# Patient Record
Sex: Male | Born: 1985 | Race: White | Hispanic: No | Marital: Single | Smoking: Former smoker
Health system: Southern US, Community
[De-identification: ages and names within clinical notes are randomized; demographics above are authoritative.]

## PROBLEM LIST (undated history)

## (undated) HISTORY — PX: WISDOM TOOTH EXTRACTION: SHX21

---
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
## 2003-10-03 ENCOUNTER — Emergency Department (HOSPITAL_COMMUNITY): Admission: EM | Admit: 2003-10-03 | Discharge: 2003-10-03 | Payer: Self-pay | Admitting: Emergency Medicine

## 2005-10-17 ENCOUNTER — Emergency Department (HOSPITAL_COMMUNITY): Admission: EM | Admit: 2005-10-17 | Discharge: 2005-10-17 | Payer: Self-pay | Admitting: Emergency Medicine

## 2009-05-03 ENCOUNTER — Emergency Department (HOSPITAL_COMMUNITY): Admission: EM | Admit: 2009-05-03 | Discharge: 2009-05-03 | Payer: Self-pay | Admitting: Emergency Medicine

## 2012-11-03 ENCOUNTER — Emergency Department (HOSPITAL_COMMUNITY): Payer: Self-pay

## 2012-11-03 ENCOUNTER — Emergency Department (HOSPITAL_COMMUNITY)
Admission: EM | Admit: 2012-11-03 | Discharge: 2012-11-03 | Disposition: A | Discharge status: Home / Self Care | Payer: Self-pay | Attending: Emergency Medicine | Admitting: Emergency Medicine

## 2012-11-03 ENCOUNTER — Encounter (HOSPITAL_COMMUNITY): Payer: Self-pay | Admitting: Emergency Medicine

## 2012-11-03 DIAGNOSIS — Y92410 Unspecified street and highway as the place of occurrence of the external cause: Secondary | ICD-10-CM | POA: Insufficient documentation

## 2012-11-03 DIAGNOSIS — S52599A Other fractures of lower end of unspecified radius, initial encounter for closed fracture: Secondary | ICD-10-CM | POA: Insufficient documentation

## 2012-11-03 DIAGNOSIS — Y9389 Activity, other specified: Secondary | ICD-10-CM | POA: Insufficient documentation

## 2012-11-03 DIAGNOSIS — F17200 Nicotine dependence, unspecified, uncomplicated: Secondary | ICD-10-CM | POA: Insufficient documentation

## 2012-11-03 MED ORDER — OXYCODONE-ACETAMINOPHEN 5-325 MG PO TABS: 1.0000 | ORAL_TABLET | Freq: Four times a day (QID) | ORAL | Status: DC | PRN

## 2012-11-03 MED ORDER — ONDANSETRON HCL 4 MG PO TABS
4.0000 mg | ORAL_TABLET | Freq: Once | ORAL | Status: AC
  Administered 2012-11-03: 4 mg via ORAL
  Filled 2012-11-03: qty 1

## 2012-11-03 MED ORDER — OXYCODONE-ACETAMINOPHEN 5-325 MG PO TABS
1.0000 | ORAL_TABLET | Freq: Once | ORAL | Status: AC
  Administered 2012-11-03: 1 via ORAL
  Filled 2012-11-03: qty 1

## 2012-11-03 MED ORDER — MELOXICAM 7.5 MG PO TABS: ORAL_TABLET | ORAL | Status: DC

## 2012-11-03 MED ORDER — KETOROLAC TROMETHAMINE 10 MG PO TABS
10.0000 mg | ORAL_TABLET | Freq: Once | ORAL | Status: AC
  Administered 2012-11-03: 10 mg via ORAL
  Filled 2012-11-03: qty 1
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
## 2012-11-04 NOTE — ED Provider Notes (Signed)
Medical screening examination/treatment/procedure(s) were performed by non-physician practitioner and as supervising physician I was immediately available for consultation/collaboration.  Donnetta Hutching, MD 11/04/12 [phone]

## 2012-11-10 ENCOUNTER — Ambulatory Visit: Payer: Self-pay | Admitting: Orthopedic Surgery

## 2012-11-10 ENCOUNTER — Encounter: Payer: Self-pay | Admitting: Orthopedic Surgery

## 2014-12-13 ENCOUNTER — Emergency Department (HOSPITAL_COMMUNITY): Payer: Managed Care, Other (non HMO)

## 2014-12-13 ENCOUNTER — Emergency Department (HOSPITAL_COMMUNITY)
Admission: EM | Admit: 2014-12-13 | Discharge: 2014-12-13 | Disposition: A | Discharge status: Home / Self Care | Payer: Managed Care, Other (non HMO) | Attending: Emergency Medicine | Admitting: Emergency Medicine

## 2014-12-13 ENCOUNTER — Encounter (HOSPITAL_COMMUNITY): Payer: Self-pay | Admitting: Emergency Medicine

## 2014-12-13 DIAGNOSIS — Y9289 Other specified places as the place of occurrence of the external cause: Secondary | ICD-10-CM | POA: Diagnosis not present

## 2014-12-13 DIAGNOSIS — Y9389 Activity, other specified: Secondary | ICD-10-CM | POA: Insufficient documentation

## 2014-12-13 DIAGNOSIS — S63601A Unspecified sprain of right thumb, initial encounter: Secondary | ICD-10-CM | POA: Insufficient documentation

## 2014-12-13 DIAGNOSIS — S6991XA Unspecified injury of right wrist, hand and finger(s), initial encounter: Secondary | ICD-10-CM | POA: Diagnosis present

## 2014-12-13 DIAGNOSIS — Z720 Tobacco use: Secondary | ICD-10-CM | POA: Diagnosis not present

## 2014-12-13 DIAGNOSIS — X58XXXA Exposure to other specified factors, initial encounter: Secondary | ICD-10-CM | POA: Insufficient documentation

## 2014-12-13 DIAGNOSIS — Y998 Other external cause status: Secondary | ICD-10-CM | POA: Insufficient documentation

## 2014-12-13 MED ORDER — HYDROCODONE-ACETAMINOPHEN 5-325 MG PO TABS: ORAL_TABLET | ORAL | Status: DC

## 2014-12-13 NOTE — Discharge Instructions (Signed)
Gamekeeper's, Skier's Thumb °You have injured some ligaments in your thumb. This can happen suddenly or over a long period of time. It may be difficult for you to hold things by pinching them between your thumb and finger. The injury may take 6 to 8 weeks to heal. Your injury is a strain injury and not a complete tear so it may be treated with a cast. A complete tear of the ligament can lead to a loss of function of the thumb if not fixed surgically. It got its name from when gamekeepers used to kill game (hunted or trapped animals) by pinning them down around the neck between their thumb and index finger.  °HOME CARE INSTRUCTIONS  °· Apply ice to the injury for 15-20 minutes, 03-04 times per day for the first 2 days. Put the ice in a plastic bag and place a towel between the bag of ice and your skin. °· Avoid using your thumb for as long as directed by your caregiver if it is not splinted or in a cast. °· If your hand has been casted or splinted while your thumb heals, follow these instructions: °· Plaster or fiberglass cast: °¨ Do not try to scratch the skin under the cast using a sharp or pointed object. °¨ Check the skin around the cast every day. You may put lotion on any red or sore areas. °¨ Keep your cast dry. Your cast can be protected during bathing with a plastic bag. Do not put your cast into the water. °¨ If your fiberglass cast gets wet, it can be gently dried using a hair dryer. Be careful not burn yourself. °· Plaster splint: °¨ Wear the splint for as long as directed by your caregiver or until you are seen for a follow-up examination. °¨ Do not get your splint wet. Protect it during bathing with a plastic bag. °¨ You may loosen the elastic bandage around the splint if your fingers start to get numb, tingle, get cold, or turn blue. °· Do not put pressure on your cast or splint; this may cause it to break. Do not lean on hard surfaces for 24 hours after application. °· Only take over-the-counter or  prescription medicines for pain, discomfort, or fever as directed by your caregiver. °· IMPORTANT: follow up with your caregiver or keep or call for any appointments with specialists as directed. The failure to follow up could result in chronic pain and / or disability. °SEEK IMMEDIATE MEDICAL CARE IF:  °Your thumb or fingers change color, become painful, or there is numbness or tingling in your thumb or fingers. Your cast or splint may be too tight. °MAKE SURE YOU:  °· Understand these instructions. °· Will watch your condition. °· Will get help right away if you are not doing well or get worse. °Document Released: 04/28/2005 Document Revised: 07/21/2011 Document Reviewed: 12/16/2007 °[employer]® Patient Information ©2015 [employer]. This information is not intended to replace advice given to you by your health care provider. Make sure you discuss any questions you have with your health care provider. ° °

## 2014-12-13 NOTE — ED Provider Notes (Signed)
CSN: [id number]     Arrival date & time 12/13/14  1914 History   First MD Initiated Contact with Patient 12/13/14 1046     Chief Complaint  Patient presents with  . Hand Injury    right hand     (Consider location/radiation/quality/duration/timing/severity/associated sxs/prior Treatment) HPI    Phillip Shaw is a 29 y.o. male who presents to the Emergency Department complaining of right thumb pain for three days.  He reports sudden onset of pain after a hyperextension injury.  He reports swelling and pain to his thumb with movement.  He reports taking ibuprofen with mild improvement of pain.  He denies discoloration, numbness and wrist pain.    History reviewed. No pertinent past medical history. Past Surgical History  Procedure Laterality Date  . Wisdom tooth extraction     Family History  Problem Relation Age of Onset  . Cancer Other   . Diabetes Other    History  Substance Use Topics  . Smoking status: Current Every Day Smoker -- 1.00 packs/day for 14 years    Types: Cigarettes  . Smokeless tobacco: Never Used  . Alcohol Use: 7.2 oz/week    12 Cans of beer per week    Review of Systems  Constitutional: Negative for fever and chills.  Musculoskeletal: Positive for joint swelling and arthralgias (right thumb pain).  Skin: Negative for color change and wound.  Neurological: Negative for weakness and numbness.  All other systems reviewed and are negative.     Allergies  Review of patient's allergies indicates no known allergies.  Home Medications   Prior to Admission medications   Medication Sig Start Date End Date Taking? Authorizing Provider  ibuprofen (ADVIL,MOTRIN) 200 MG tablet Take 800 mg by mouth every 6 (six) hours as needed for moderate pain.   Yes Historical Provider, MD  HYDROcodone-acetaminophen (NORCO/VICODIN) 5-325 MG per tablet Take one-two tabs po q 4-6 hrs prn pain 12/13/14   Sherea Liptak, PA-C   BP 138/97 mmHg  Pulse 57  Temp(Src) 98.1 F (36.7  C) (Oral)  Resp 16  Ht 5\' 10"  (1.778 m)  Wt 237 lb (107.502 kg)  BMI 34.01 kg/m2  SpO2 99% Physical Exam  Constitutional: He is oriented to person, place, and time. He appears well-developed and well-nourished. No distress.  HENT:  Head: Normocephalic and atraumatic.  Cardiovascular: Normal rate, regular rhythm and intact distal pulses.   Pulmonary/Chest: Effort normal and breath sounds normal.  Musculoskeletal: He exhibits edema and tenderness.  ttp of the proximal right thumb.  Edema of the thenar eminence right thumb. Distal sensation intact, CR < 2 sec.  Wrist is NT    Neurological: He is alert and oriented to person, place, and time.  Skin: Skin is warm and dry.  Nursing note and vitals reviewed.   ED Course  Procedures (including critical care time) Labs Review Labs Reviewed - No data to display  Imaging Review Dg Hand Complete Right  12/13/2014   CLINICAL DATA:  RIGHT hand pain particularly at RIGHT thumb radiating to carpal bones, thumb bent backwards while playing around, history of distal radial fracture and smoking  EXAM: RIGHT HAND - COMPLETE 3+ VIEW  COMPARISON:  RIGHT wrist radiographs 11/03/2012, RIGHT hand radiographs 10/03/2003  FINDINGS: Osseous mineralization normal.  Joint spaces preserved.  Radial styloid fracture seen on previous exam healed.  No acute fracture, dislocation, or bone destruction.  Radiopacities project at the tip of the distal phalanx question foreign bodies at nail.  IMPRESSION: No  definite acute osseous abnormalities.   Electronically Signed   By: Ulyses Southward M.D.   On: 12/13/2014 10:37     EKG Interpretation None      MDM   Final diagnoses:  Thumb sprain, right, initial encounter    Thumb spica splint applied.  Pain improved, remains NV intact.  Agrees to symptomatic tx and f/u with ortho.    Pauline Aus, PA-C 12/13/14 1926  Vanetta Mulders, MD 12/14/14 [phone]
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
## 2015-12-10 ENCOUNTER — Emergency Department (HOSPITAL_COMMUNITY)
Admission: EM | Admit: 2015-12-10 | Discharge: 2015-12-10 | Disposition: A | Discharge status: Home / Self Care | Payer: Managed Care, Other (non HMO) | Attending: Emergency Medicine | Admitting: Emergency Medicine

## 2015-12-10 ENCOUNTER — Encounter (HOSPITAL_COMMUNITY): Payer: Self-pay | Admitting: Emergency Medicine

## 2015-12-10 DIAGNOSIS — L03314 Cellulitis of groin: Secondary | ICD-10-CM | POA: Diagnosis not present

## 2015-12-10 DIAGNOSIS — L0291 Cutaneous abscess, unspecified: Secondary | ICD-10-CM

## 2015-12-10 DIAGNOSIS — Z87891 Personal history of nicotine dependence: Secondary | ICD-10-CM | POA: Insufficient documentation

## 2015-12-10 DIAGNOSIS — L0390 Cellulitis, unspecified: Secondary | ICD-10-CM

## 2015-12-10 DIAGNOSIS — L02214 Cutaneous abscess of groin: Secondary | ICD-10-CM | POA: Diagnosis present

## 2015-12-10 LAB — BASIC METABOLIC PANEL
ANION GAP: 6 (ref 5–15)
BUN: 12 mg/dL (ref 6–20)
CALCIUM: 8.9 mg/dL (ref 8.9–10.3)
CO2: 29 mmol/L (ref 22–32)
Chloride: 100 mmol/L — ABNORMAL LOW (ref 101–111)
Creatinine, Ser: 1.07 mg/dL (ref 0.61–1.24)
GFR calc non Af Amer: >60 mL/min (ref >60)
Glucose, Bld: 105 mg/dL — ABNORMAL HIGH (ref 65–99)
Potassium: 4 mmol/L (ref 3.5–5.1)
Sodium: 135 mmol/L (ref 135–145)

## 2015-12-10 LAB — CBC WITH DIFFERENTIAL/PLATELET
BASOS ABS: 0 10*3/uL (ref 0.0–0.1)
BASOS PCT: 0 %
Eosinophils Absolute: 0.2 10*3/uL (ref 0.0–0.7)
Eosinophils Relative: 2 %
HEMATOCRIT: 45.7 % (ref 39.0–52.0)
HEMOGLOBIN: 15.8 g/dL (ref 13.0–17.0)
Lymphocytes Relative: 17 %
Lymphs Abs: 1.5 10*3/uL (ref 0.7–4.0)
MCH: 31.8 pg (ref 26.0–34.0)
MCHC: 34.6 g/dL (ref 30.0–36.0)
MCV: 92 fL (ref 78.0–100.0)
MONO ABS: 1 10*3/uL (ref 0.1–1.0)
Monocytes Relative: 11 %
NEUTROS ABS: 6.2 10*3/uL (ref 1.7–7.7)
NEUTROS PCT: 70 %
Platelets: 201 10*3/uL (ref 150–400)
RBC: 4.97 MIL/uL (ref 4.22–5.81)
RDW: 12.5 % (ref 11.5–15.5)
WBC: 8.9 10*3/uL (ref 4.0–10.5)

## 2015-12-10 MED ORDER — SULFAMETHOXAZOLE-TRIMETHOPRIM 800-160 MG PO TABS: 1.0000 | ORAL_TABLET | Freq: Two times a day (BID) | ORAL | 0 refills | Status: AC

## 2015-12-10 MED ORDER — LIDOCAINE HCL (PF) 1 % IJ SOLN
5.0000 mL | Freq: Once | INTRAMUSCULAR | Status: AC
  Administered 2015-12-10: 5 mL
  Filled 2015-12-10: qty 5

## 2015-12-10 MED ORDER — MORPHINE SULFATE (PF) 4 MG/ML IV SOLN
4.0000 mg | Freq: Once | INTRAVENOUS | Status: AC
  Administered 2015-12-10: 4 mg via INTRAVENOUS
  Filled 2015-12-10: qty 1

## 2015-12-10 MED ORDER — VANCOMYCIN HCL IN DEXTROSE 1-5 GM/200ML-% IV SOLN
1000.0000 mg | Freq: Once | INTRAVENOUS | Status: AC
  Administered 2015-12-10: 1000 mg via INTRAVENOUS
  Filled 2015-12-10: qty 200

## 2015-12-10 MED ORDER — HYDROCODONE-ACETAMINOPHEN 5-325 MG PO TABS: 1.0000 | ORAL_TABLET | ORAL | 0 refills | Status: DC | PRN

## 2015-12-10 MED ORDER — ONDANSETRON HCL 4 MG/2ML IJ SOLN
4.0000 mg | Freq: Once | INTRAMUSCULAR | Status: AC
  Administered 2015-12-10: 4 mg via INTRAVENOUS
  Filled 2015-12-10: qty 2

## 2015-12-10 NOTE — ED Provider Notes (Signed)
[facility]-EMERGENCY DEPT Provider Note   CSN: [id number] Arrival date & time: 12/10/15  1104  By signing my name below, I, Alyssa Grove, attest that this documentation has been prepared under the direction and in the presence of Burgess Amor, [place]. Electronically Signed: Alyssa Grove, ED Scribe. 12/10/15, 4:45 PM.  First MD Initiated Contact with Patient 12/10/15 1355    History   Chief Complaint Chief Complaint  Patient presents with  . Abscess    The history is provided by the patient. No language interpreter was used.    HPI Comments: Phillip Shaw is a 30 y.o. male who presents to the Emergency Department complaining of a large painful area of induration and erythema on the right groin area onset 3 days. Pt states he had chills, fatigue and felt warm yesterday, but did not check his temperature to confirm a fever. He also describes lower abdominal pain and pain radiating down to his right knee. Pt states he tried to squeeze it area which caused it to produce pus and blood. Pt states 2 days ago the area started to gradually worsen. Pt states he has had similar symptoms of small skin outbreaks in his groin but not to this degree. Pt denies having PCP and is not currently taking any medications. Pt denies nausea, vomiting. He denies any known drug allergies.   Chaperone present during examination of area.  History reviewed. No pertinent past medical history.  There are no active problems to display for this patient.   Past Surgical History:  Procedure Laterality Date  . WISDOM TOOTH EXTRACTION        Home Medications    Prior to Admission medications   Medication Sig Start Date End Date Taking? Authorizing Provider  HYDROcodone-acetaminophen (NORCO/VICODIN) 5-325 MG tablet Take 1 tablet by mouth every 4 (four) hours as needed. 12/10/15   Burgess Amor, PA-C  ibuprofen (ADVIL,MOTRIN) 200 MG tablet Take 800 mg by mouth every 6 (six) hours as needed for moderate pain.    Historical  Provider, MD  sulfamethoxazole-trimethoprim (BACTRIM DS,SEPTRA DS) 800-160 MG tablet Take 1 tablet by mouth 2 (two) times daily. 12/10/15 12/17/15  Burgess Amor, PA-C    Family History Family History  Problem Relation Age of Onset  . Cancer Other   . Diabetes Other     Social History Social History  Substance Use Topics  . Smoking status: Former Smoker    Packs/day: 1.00    Years: 14.00    Types: Cigarettes    Quit date: 05/02/2015  . Smokeless tobacco: Never Used  . Alcohol use 7.2 oz/week    12 Cans of beer per week     Comment: socially     Allergies   Review of patient's allergies indicates no known allergies.   Review of Systems Review of Systems  Constitutional: Positive for chills and fatigue. Negative for fever.  Gastrointestinal: Negative for nausea and vomiting.       Negative except as mentioned in HPI. Pain in right groin.   Genitourinary: Negative.   Skin: Positive for color change.       Area of induration on right groin    Physical Exam Updated Vital Signs BP 135/76 (BP Location: Right Arm)   Pulse 86   Temp 98.6 F (37 C) (Oral)   Resp 18   Ht 5\' 9"  (1.753 m)   Wt 250 lb (113.4 kg)   SpO2 98%   BMI 36.92 kg/m   Physical Exam  Constitutional: He appears well-developed  and well-nourished.  HENT:  Head: Normocephalic.  Eyes: Conjunctivae are normal.  Cardiovascular: Normal rate.   Pulmonary/Chest: Effort normal. No respiratory distress.  Abdominal: He exhibits no distension.  Musculoskeletal: Normal range of motion.  Neurological: He is alert.  Skin: Skin is warm and dry.  Indurated raised abscess which extends from his right mid anterior groin and tracts back to his right perirectal soft tissue No active drainage Moderate erythema No red streaking No inguinale adenopathy  Psychiatric: He has a normal mood and affect. His behavior is normal.  Nursing note and vitals reviewed.    ED Treatments / Results  DIAGNOSTIC STUDIES: Oxygen  Saturation is 100% on RA, normal by my interpretation.    COORDINATION OF CARE: 2:00 PM  Discussed possible need of surgical intervention due to size. Dr. Deretha Emory will examine pt and return with treatment plan.  2:38 PM Discussed treatment plan with pt at bedside which includes BMP and CBC, IV PB Vancomycin, and Incision and Drainage and pt agreed to plan.  4:33 PM Discussed follow up appointment at ED tomorrow for recheck. Pt prescribed Hydrocodone and Bactrim and advised to soak area 3-4 times a day.   Labs (all labs ordered are listed, but only abnormal results are displayed) Labs Reviewed  BASIC METABOLIC PANEL - Abnormal; Notable for the following:       Result Value   Chloride 100 (*)    Glucose, Bld 105 (*)    All other components within normal limits  CBC WITH DIFFERENTIAL/PLATELET    EKG  EKG Interpretation None       Radiology No results found.  Procedures .Marland KitchenIncision and Drainage Date/Time: 12/10/2015 4:02 PM Performed by: Burgess Amor Authorized by: Burgess Amor   Consent:    Consent obtained:  Verbal   Consent given by:  Patient   Risks discussed:  Infection and pain Location:    Type:  Abscess   Size:  6 cm  area of induration with 1 cm area of fluctuance   Location:  Anogenital   Anogenital location:  Scrotal space Pre-procedure details:    Skin preparation:  Betadine Anesthesia (see MAR for exact dosages):    Anesthesia method:  Local infiltration   Local anesthetic:  Lidocaine 1% w/o epi Procedure type:    Complexity:  Simple Procedure details:    Needle aspiration: no     Incision types:  Single straight   Incision depth:  Subcutaneous   Scalpel blade:  11   Wound management:  Probed and deloculated and irrigated with saline   Drainage:  Purulent and bloody   Drainage amount:  Moderate   Wound treatment:  Wound left open   Packing materials:  1/2 in iodoform gauze   Amount 1/2" iodoform:  2 cm Post-procedure details:    Patient tolerance  of procedure:  Tolerated well, no immediate complications Comments:     Chaperone present through procedure     (including critical care time)  Medications Ordered in ED Medications  vancomycin (VANCOCIN) IVPB 1000 mg/200 mL premix (0 mg Intravenous Stopped 12/10/15 1624)  lidocaine (PF) (XYLOCAINE) 1 % injection 5 mL (5 mLs Other Given 12/10/15 1622)  morphine 4 MG/ML injection 4 mg (4 mg Intravenous Given 12/10/15 1623)  ondansetron (ZOFRAN) injection 4 mg (4 mg Intravenous Given 12/10/15 1623)     Initial Impression / Assessment and Plan / ED Course  I have reviewed the triage vital signs and the nursing notes.  Pertinent labs & imaging results that were  available during my care of the patient were reviewed by me and considered in my medical decision making (see chart for details).  Clinical Course     Planned recheck here tomorrow.   Final Clinical Impressions(s) / ED Diagnoses   Final diagnoses:  Abscess and cellulitis    New Prescriptions New Prescriptions   HYDROCODONE-ACETAMINOPHEN (NORCO/VICODIN) 5-325 MG TABLET    Take 1 tablet by mouth every 4 (four) hours as needed.   SULFAMETHOXAZOLE-TRIMETHOPRIM (BACTRIM DS,SEPTRA DS) 800-160 MG TABLET    Take 1 tablet by mouth 2 (two) times daily.   I personally performed the services described in this documentation, which was scribed in my presence. The recorded information has been reviewed and is accurate.    Burgess Amor, PA-C 12/10/15 1746

## 2015-12-10 NOTE — ED Provider Notes (Signed)
Medical screening examination/treatment/procedure(s) were conducted as a shared visit with non-physician practitioner(s) and myself.  I personally evaluated the patient during the encounter.   EKG Interpretation None      Patient seen by me. Patient with right-sided groin abscess that involves the base of the scrotum and cellulitis extending down to the right perianal area. Patient was able to express pus out of this area on Saturday but now the redness and swelling is increased. Patient nontoxic in appearance. Do recommend one dose of IV vancomycin a basic labs and I&D of the fluctuant site and packing. Patient will need to come back for close follow-up tomorrow.   Vanetta Mulders, MD 12/10/15 [phone]

## 2015-12-10 NOTE — ED Notes (Signed)
C/o right groin (8/10), lower abdominal (5/10) and right upper leg pain (5/10).  Area to groin swollen with raised areas noted.  C/o pain shooting from right hip down to right knee.

## 2015-12-10 NOTE — ED Triage Notes (Signed)
PT c/o abscess with some drainage to right groin x3 days with no fever.

## 2019-11-30 ENCOUNTER — Ambulatory Visit
Admission: EM | Admit: 2019-11-30 | Discharge: 2019-11-30 | Disposition: A | Discharge status: Home / Self Care | Payer: BC Managed Care – PPO | Attending: Emergency Medicine | Admitting: Emergency Medicine

## 2019-11-30 ENCOUNTER — Encounter: Payer: Self-pay | Admitting: Emergency Medicine

## 2019-11-30 DIAGNOSIS — S0502XA Injury of conjunctiva and corneal abrasion without foreign body, left eye, initial encounter: Secondary | ICD-10-CM

## 2019-11-30 MED ORDER — OFLOXACIN 0.3 % OP SOLN: 1.0000 [drp] | Freq: Four times a day (QID) | OPHTHALMIC | 0 refills | Status: DC

## 2019-11-30 NOTE — ED Provider Notes (Signed)
[facility]   [id number] 11/30/19 Arrival Time: 0915  Chief Complaint  Patient presents with  . Eye Injury    left     SUBJECTIVE:  Phillip Shaw is a 34 y.o. male who presents to the urgent care for complaint of left eye irritation with watery discharge that occurred yesterday.  Report piece of metal scratch his left eye.Marland Kitchen  Has tried OTC eye drops without relief.  Symptoms are made worse by opening his left eye.  Denies similar symptoms in the past.  Denies fever, chills, nausea, vomiting, painful eye movements, halos, discharge, itching, vision changes, double vision, FB sensation, periorbital erythema.     Denies contact lens use.    ROS: As per HPI.  All other pertinent ROS negative.     History reviewed. No pertinent past medical history. Past Surgical History:  Procedure Laterality Date  . WISDOM TOOTH EXTRACTION     No Known Allergies No current facility-administered medications on file prior to encounter.   Current Outpatient Medications on File Prior to Encounter  Medication Sig Dispense Refill  . HYDROcodone-acetaminophen (NORCO/VICODIN) 5-325 MG tablet Take 1 tablet by mouth every 4 (four) hours as needed. 20 tablet 0  . ibuprofen (ADVIL,MOTRIN) 200 MG tablet Take 800 mg by mouth every 6 (six) hours as needed for moderate pain.     Social History   Socioeconomic History  . Marital status: Single    Spouse name: Not on file  . Number of children: Not on file  . Years of education: Not on file  . Highest education level: Not on file  Occupational History  . Not on file  Tobacco Use  . Smoking status: Former Smoker    Packs/day: 1.00    Years: 14.00    Pack years: 14.00    Types: Cigarettes    Quit date: 05/02/2015    Years since quitting: 4.5  . Smokeless tobacco: Never Used  Substance and Sexual Activity  . Alcohol use: Yes    Alcohol/week: 12.0 standard drinks    Types: 12 Cans of beer per week    Comment: socially  . Drug use: No     Types: Marijuana    Comment: occasional  . Sexual activity: Yes  Other Topics Concern  . Not on file  Social History Narrative  . Not on file   Social Determinants of Health   Financial Resource Strain:   . Difficulty of Paying Living Expenses:   Food Insecurity:   . Worried About Programme researcher, broadcasting/film/video in the Last Year:   . Barista in the Last Year:   Transportation Needs:   . Freight forwarder (Medical):   Marland Kitchen Lack of Transportation (Non-Medical):   Physical Activity:   . Days of Exercise per Week:   . Minutes of Exercise per Session:   Stress:   . Feeling of Stress :   Social Connections:   . Frequency of Communication with Friends and Family:   . Frequency of Social Gatherings with Friends and Family:   . Attends Religious Services:   . Active Member of Clubs or Organizations:   . Attends Banker Meetings:   Marland Kitchen Marital Status:   Intimate Partner Violence:   . Fear of Current or Ex-Partner:   . Emotionally Abused:   Marland Kitchen Physically Abused:   . Sexually Abused:    Family History  Problem Relation Age of Onset  . Cancer Other   . Diabetes Other  OBJECTIVE:    Visual Acuity  Right Eye Distance: 20 Left Eye Distance: 20 Bilateral Distance: 20  Right Eye Near: R Near: 20 Left Eye Near:  L Near: 20 Bilateral Near:  20   Vitals:   11/30/19 0931  BP: 136/85  Pulse: 72  Resp: 16  Temp: 98.4 F (36.9 C)  TempSrc: Oral  SpO2: 97%  Weight: 250 lb (113.4 kg)    Physical Exam Vitals and nursing note reviewed.  Constitutional:      General: He is not in acute distress.    Appearance: Normal appearance. He is normal weight. He is not ill-appearing, toxic-appearing or diaphoretic.  Eyes:     General: Lids are normal. Lids are everted, no foreign bodies appreciated. Vision grossly intact. Gaze aligned appropriately. No visual field deficit.       Right eye: No foreign body, discharge or hordeolum.        Left eye: Discharge present.No  foreign body or hordeolum.     Extraocular Movements: Extraocular movements intact.     Comments: Left eye redness and irritation.  No foreign body present on exam.  Fluorescein uptake was present  Cardiovascular:     Rate and Rhythm: Normal rate and regular rhythm.     Pulses: Normal pulses.     Heart sounds: Normal heart sounds. No murmur heard.  No friction rub. No gallop.   Pulmonary:     Effort: Pulmonary effort is normal. No respiratory distress.     Breath sounds: Normal breath sounds. No stridor. No wheezing, rhonchi or rales.  Chest:     Chest wall: No tenderness.  Neurological:     Mental Status: He is alert and oriented to person, place, and time.      ASSESSMENT & PLAN:  1. Abrasion of left cornea, initial encounter     Meds ordered this encounter  Medications  . ofloxacin (OCUFLOX) 0.3 % ophthalmic solution    Sig: Place 1 drop into the left eye 4 (four) times daily.    Dispense:  5 mL    Refill:  0   Patient is stable at discharge.  Symptom is likely from corneal abrasion.  Eyes was flush in office.  Fluorescein test was completed with uptake.  Ofloxacin ophthalmic was prescribed.  Discharge Instructions  Use ofloxacin as prescribed and to completion Use OTC systane or genteal gel eye drops at night as needed for symptomatic relief Use eye patch to help keep your eyes closed Use OTC ibuprofen or tylenol as needed for pain relief Return here or follow up with ophthamolgy if symptoms persists or worsen such as fever, chills, redness, swelling, eye pain, painful eye movements, vision changes, etc...  Reviewed expectations re: course of current medical issues. Questions answered. Outlined signs and symptoms indicating need for more acute intervention. Patient verbalized understanding. After Visit Summary given.      Note: This document was prepared using Dragon voice recognition software and may include unintentional dictation errors.    Durward Parcel, FNP 11/30/19 [phone]

## 2019-11-30 NOTE — ED Triage Notes (Signed)
Yesterday piece of metal swiped his left eye. Pain in eye.  Red, inflamed eye.

## 2019-11-30 NOTE — Discharge Instructions (Addendum)
Use ofloxacin as prescribed and to completion Use OTC systane or genteal gel eye drops at night as needed for symptomatic relief Use eye patch to help keep your eyes closed Use OTC ibuprofen or tylenol as needed for pain relief Return here or follow up with ophthamolgy if symptoms persists or worsen such as fever, chills, redness, swelling, eye pain, painful eye movements, vision changes, etc..Marland Kitchen

## 2021-09-11 ENCOUNTER — Ambulatory Visit
Admission: EM | Admit: 2021-09-11 | Discharge: 2021-09-11 | Disposition: A | Discharge status: Home / Self Care | Payer: BC Managed Care – PPO | Attending: Nurse Practitioner | Admitting: Nurse Practitioner

## 2021-09-11 ENCOUNTER — Encounter: Payer: Self-pay | Admitting: Emergency Medicine

## 2021-09-11 DIAGNOSIS — R051 Acute cough: Secondary | ICD-10-CM

## 2021-09-11 DIAGNOSIS — J309 Allergic rhinitis, unspecified: Secondary | ICD-10-CM | POA: Diagnosis not present

## 2021-09-11 DIAGNOSIS — R6889 Other general symptoms and signs: Secondary | ICD-10-CM | POA: Diagnosis not present

## 2021-09-11 MED ORDER — CETIRIZINE HCL 10 MG PO TABS: 10.0000 mg | ORAL_TABLET | Freq: Every day | ORAL | 0 refills | Status: DC

## 2021-09-11 MED ORDER — ALBUTEROL SULFATE HFA 108 (90 BASE) MCG/ACT IN AERS: 2.0000 | INHALATION_SPRAY | Freq: Four times a day (QID) | RESPIRATORY_TRACT | 0 refills | Status: AC | PRN

## 2021-09-11 MED ORDER — FLUTICASONE PROPIONATE 50 MCG/ACT NA SUSP: 2.0000 | Freq: Every day | NASAL | 0 refills | Status: DC

## 2021-09-11 NOTE — Discharge Instructions (Signed)
Your COVID results should be available within the next 24 to 48 hours.  As discussed, if you activate your MyChart account, you will be able to see your results there.  You will be contacted if your results are positive. ?Increase fluids and get plenty of rest. ?Continue ibuprofen or Tylenol for pain, fever, or general discomfort. ?As discussed, it may be beneficial to wear a mask when you are outside. ?Use your albuterol inhaler for any chest tightness, shortness of breath, or difficulty breathing. ?Follow-up if symptoms worsen or do not improve. ?

## 2021-09-11 NOTE — ED Provider Notes (Signed)
[facility]    CSN: [id number] Arrival date & time: 09/11/21  4098      History   Chief Complaint No chief complaint on file.   HPI Phillip Shaw is a 36 y.o. male.   The patient is a 36 year old male who presents for flulike symptoms.  Symptoms have been present for the past week.  He states his son was taken down to [place] and was in the hospital, and he "thinks he brought back something".  Since that time, he complains of fever, chills, body aches, cough, nasal congestion, and nausea.  He also states last evening he was outside mowing and weed eating, and developed a severe allergy attack.  He states he had nasal congestion, cough, runny, and watery eyes.  Patient states "I felt like I was pepper sprayed".  He states his last fever was last evening, it was around 100.  He states he has been taking Tylenol for his symptoms.  He reports that he does not need to be seen for his foot but just wanted to make sure that it was not related to his flulike symptoms.  Patient states he would like a COVID test for his own information.  The history is provided by the patient.   History reviewed. No pertinent past medical history.  There are no problems to display for this patient.   Past Surgical History:  Procedure Laterality Date   WISDOM TOOTH EXTRACTION         Home Medications    Prior to Admission medications   Medication Sig Start Date End Date Taking? Authorizing Provider  albuterol (VENTOLIN HFA) 108 (90 Base) MCG/ACT inhaler Inhale 2 puffs into the lungs every 6 (six) hours as needed for wheezing or shortness of breath. 09/11/21  Yes Leath-Warren, Sadie Haber, NP  cetirizine (ZYRTEC) 10 MG tablet Take 1 tablet (10 mg total) by mouth daily. 09/11/21  Yes Leath-Warren, Sadie Haber, NP  fluticasone (FLONASE) 50 MCG/ACT nasal spray Place 2 sprays into both nostrils daily. 09/11/21  Yes Leath-Warren, Sadie Haber, NP  HYDROcodone-acetaminophen (NORCO/VICODIN) 5-325 MG tablet  Take 1 tablet by mouth every 4 (four) hours as needed. 12/10/15   Burgess Amor, PA-C  ibuprofen (ADVIL,MOTRIN) 200 MG tablet Take 800 mg by mouth every 6 (six) hours as needed for moderate pain.    [provider]  ofloxacin (OCUFLOX) 0.3 % ophthalmic solution Place 1 drop into the left eye 4 (four) times daily. 11/30/19   Durward Parcel, FNP    Family History Family History  Problem Relation Age of Onset   Cancer Other    Diabetes Other     Social History Social History   Tobacco Use   Smoking status: Former    Packs/day: 1.00    Years: 14.00    Pack years: 14.00    Types: Cigarettes    Quit date: 05/02/2015    Years since quitting: 6.3   Smokeless tobacco: Never  Vaping Use   Vaping Use: Never used  Substance Use Topics   Alcohol use: Yes    Alcohol/week: 12.0 standard drinks    Types: 12 Cans of beer per week    Comment: socially   Drug use: Yes    Types: Marijuana    Comment: occasional     Allergies   Patient has no known allergies.   Review of Systems Review of Systems  Constitutional:  Positive for fever.  HENT:  Positive for congestion and sore throat. Negative for ear  pain.   Eyes:  Positive for discharge and itching.  Respiratory:  Positive for cough. Negative for shortness of breath and wheezing.   Cardiovascular: Negative.   Gastrointestinal:  Positive for nausea.  Skin: Negative.   Psychiatric/Behavioral: Negative.      Physical Exam Triage Vital Signs ED Triage Vitals  Enc Vitals Group     BP 09/11/21 0934 (!) 156/98     Pulse Rate 09/11/21 0934 85     Resp 09/11/21 0934 18     Temp 09/11/21 0934 98.9 F (37.2 C)     Temp Source 09/11/21 0934 Oral     SpO2 09/11/21 0934 97 %     Weight --      Height --      Head Circumference --      Peak Flow --      Pain Score 09/11/21 0935 5     Pain Loc --      Pain Edu? --      Excl. in GC? --    No data found.  Updated Vital Signs BP (!) 156/98 (BP Location: Right Arm)   Pulse  85   Temp 98.9 F (37.2 C) (Oral)   Resp 18   SpO2 97%   Visual Acuity Right Eye Distance:   Left Eye Distance:   Bilateral Distance:    Right Eye Near:   Left Eye Near:    Bilateral Near:     Physical Exam Vitals reviewed.  Constitutional:      General: He is not in acute distress.    Appearance: Normal appearance.  HENT:     Head: Normocephalic and atraumatic.     Right Ear: Tympanic membrane, ear canal and external ear normal.     Left Ear: Tympanic membrane, ear canal and external ear normal.     Nose: Congestion and rhinorrhea present.     Mouth/Throat:     Pharynx: Posterior oropharyngeal erythema present. No oropharyngeal exudate.  Eyes:     Extraocular Movements: Extraocular movements intact.     Conjunctiva/sclera: Conjunctivae normal.     Pupils: Pupils are equal, round, and reactive to light.  Cardiovascular:     Rate and Rhythm: Normal rate and regular rhythm.     Pulses: Normal pulses.     Heart sounds: Normal heart sounds.  Pulmonary:     Effort: Pulmonary effort is normal. No respiratory distress.     Breath sounds: Normal breath sounds. No wheezing or rales.  Abdominal:     General: Bowel sounds are normal.     Palpations: Abdomen is soft.     Tenderness: There is no abdominal tenderness.  Musculoskeletal:     Cervical back: Normal range of motion.  Skin:    General: Skin is warm and dry.  Neurological:     General: No focal deficit present.     Mental Status: He is alert and oriented to person, place, and time.  Psychiatric:        Mood and Affect: Mood normal.        Behavior: Behavior normal.     UC Treatments / Results  Labs (all labs ordered are listed, but only abnormal results are displayed) Labs Reviewed  COVID-19, FLU A+B NAA    EKG   Radiology No results found.  Procedures Procedures (including critical care time)  Medications Ordered in UC Medications - No data to display  Initial Impression / Assessment and Plan /  UC Course  I have reviewed  the triage vital signs and the nursing notes.  Pertinent labs & imaging results that were available during my care of the patient were reviewed by me and considered in my medical decision making (see chart for details).  The patient is a 36 year old male who presents with flulike symptoms and allergy symptoms.  Symptoms have been present over the past week.  His allergy symptoms started last evening.  He has had intermittent fevers, with cough, body aches, and nasal congestion.  On exam, his vital signs are stable, he is in no acute distress.  Patient is requesting a COVID test today.  Symptoms are consistent of a viral upper respiratory infection vs. rhinitis.  Patient is afebrile at this time.  Will prescribe the patient symptomatic treatment for his allergy symptoms.  We will also prescribe an albuterol inhaler for his chest tightness and shortness of breath.  Patient advised to continue supportive care to include increasing fluids and getting plenty of rest.  May continue ibuprofen or Tylenol as needed for pain or discomfort. Final Clinical Impressions(s) / UC Diagnoses   Final diagnoses:  Flu-like symptoms  Allergic rhinitis, unspecified seasonality, unspecified trigger  Acute cough     Discharge Instructions      Your COVID results should be available within the next 24 to 48 hours.  As discussed, if you activate your MyChart account, you will be able to see your results there.  You will be contacted if your results are positive. Increase fluids and get plenty of rest. Continue ibuprofen or Tylenol for pain, fever, or general discomfort. As discussed, it may be beneficial to wear a mask when you are outside. Use your albuterol inhaler for any chest tightness, shortness of breath, or difficulty breathing. Follow-up if symptoms worsen or do not improve.     ED Prescriptions     Medication Sig Dispense Auth. Provider   cetirizine (ZYRTEC) 10 MG tablet Take  1 tablet (10 mg total) by mouth daily. 30 tablet Leath-Warren, Sadie Haber, NP   fluticasone (FLONASE) 50 MCG/ACT nasal spray Place 2 sprays into both nostrils daily. 16 g Leath-Warren, Sadie Haber, NP   albuterol (VENTOLIN HFA) 108 (90 Base) MCG/ACT inhaler Inhale 2 puffs into the lungs every 6 (six) hours as needed for wheezing or shortness of breath. 8 g Leath-Warren, Sadie Haber, NP      PDMP not reviewed this encounter.   Abran Cantor, NP 09/11/21 1005

## 2021-09-11 NOTE — ED Triage Notes (Signed)
Cough, sneezing, body aches, chills, x 1 week. ? ?States left foot hurts and is swollen, states no injury.   ?

## 2021-09-13 LAB — COVID-19, FLU A+B NAA
Influenza A, NAA: NOT DETECTED
Influenza B, NAA: NOT DETECTED
SARS-CoV-2, NAA: NOT DETECTED

## 2021-09-23 ENCOUNTER — Ambulatory Visit (INDEPENDENT_AMBULATORY_CARE_PROVIDER_SITE_OTHER): Payer: BC Managed Care – PPO

## 2021-09-23 ENCOUNTER — Ambulatory Visit
Admission: EM | Admit: 2021-09-23 | Discharge: 2021-09-23 | Disposition: A | Discharge status: Home / Self Care | Payer: BC Managed Care – PPO | Attending: Family Medicine | Admitting: Family Medicine

## 2021-09-23 DIAGNOSIS — M79672 Pain in left foot: Secondary | ICD-10-CM

## 2021-09-23 MED ORDER — PREDNISONE 20 MG PO TABS: 40.0000 mg | ORAL_TABLET | Freq: Every day | ORAL | 0 refills | Status: DC

## 2021-09-23 NOTE — ED Provider Notes (Signed)
?[facility] ? ? ? ?CSN: [id number] ?Arrival date & time: 09/23/21  0849 ? ? ?  ? ?History   ?Chief Complaint ?Chief Complaint  ?Patient presents with  ? Foot Pain  ?  Left foot pain and swelling  ? ? ?HPI ?Phillip Shaw is a 36 y.o. male.  ? ?Presenting today with 3-week history of constant left dorsal foot pain, swelling with no known injury prior to onset.  He denies numbness, tingling, weakness, discoloration, loss of range of motion in the foot though states that being on his feet all day does make the pain worse.  Has tried ibuprofen, hydrocodone acetaminophen with minimal relief.  No past history of chronic foot issues. ? ? ?History reviewed. No pertinent past medical history. ? ?There are no problems to display for this patient. ? ? ?Past Surgical History:  ?Procedure Laterality Date  ? WISDOM TOOTH EXTRACTION    ? ? ? ? ? ?Home Medications   ? ?Prior to Admission medications   ?Medication Sig Start Date End Date Taking? Authorizing Provider  ?predniSONE (DELTASONE) 20 MG tablet Take 2 tablets (40 mg total) by mouth daily with breakfast. 09/23/21  Yes Particia Nearing, PA-C  ?albuterol (VENTOLIN HFA) 108 (90 Base) MCG/ACT inhaler Inhale 2 puffs into the lungs every 6 (six) hours as needed for wheezing or shortness of breath. 09/11/21   Leath-Warren, Sadie Haber, NP  ?cetirizine (ZYRTEC) 10 MG tablet Take 1 tablet (10 mg total) by mouth daily. 09/11/21   Leath-Warren, Sadie Haber, NP  ?fluticasone (FLONASE) 50 MCG/ACT nasal spray Place 2 sprays into both nostrils daily. 09/11/21   Leath-Warren, Sadie Haber, NP  ?HYDROcodone-acetaminophen (NORCO/VICODIN) 5-325 MG tablet Take 1 tablet by mouth every 4 (four) hours as needed. 12/10/15   Burgess Amor, PA-C  ?ibuprofen (ADVIL,MOTRIN) 200 MG tablet Take 800 mg by mouth every 6 (six) hours as needed for moderate pain.    [provider]  ?ofloxacin (OCUFLOX) 0.3 % ophthalmic solution Place 1 drop into the left eye 4 (four) times daily. 11/30/19   Durward Parcel, FNP  ? ? ?Family History ?Family History  ?Problem Relation Age of Onset  ? Cancer Other   ? Diabetes Other   ? ? ?Social History ?Social History  ? ?Tobacco Use  ? Smoking status: Former  ?  Packs/day: 1.00  ?  Years: 14.00  ?  Pack years: 14.00  ?  Types: Cigarettes  ?  Quit date: 05/02/2015  ?  Years since quitting: 6.4  ? Smokeless tobacco: Never  ?Vaping Use  ? Vaping Use: Never used  ?Substance Use Topics  ? Alcohol use: Yes  ?  Alcohol/week: 12.0 standard drinks  ?  Types: 12 Cans of beer per week  ?  Comment: socially  ? Drug use: Yes  ?  Types: Marijuana  ?  Comment: occasional  ? ? ? ?Allergies   ?Patient has no known allergies. ? ? ?Review of Systems ?Review of Systems ?Per HPI ? ?Physical Exam ?Triage Vital Signs ?ED Triage Vitals  ?Enc Vitals Group  ?   BP 09/23/21 0941 (!) 137/92  ?   Pulse Rate 09/23/21 0941 84  ?   Resp 09/23/21 0941 20  ?   Temp 09/23/21 0941 98.1 ?F (36.7 ?C)  ?   Temp Source 09/23/21 0941 Oral  ?   SpO2 09/23/21 0941 94 %  ?   Weight --   ?   Height --   ?  Head Circumference --   ?   Peak Flow --   ?   Pain Score 09/23/21 0939 0  ?   Pain Loc --   ?   Pain Edu? --   ?   Excl. in GC? --   ? ?No data found. ? ?Updated Vital Signs ?BP (!) 137/92 (BP Location: Right Arm)   Pulse 84   Temp 98.1 ?F (36.7 ?C) (Oral)   Resp 20   SpO2 94%  ? ?Visual Acuity ?Right Eye Distance:   ?Left Eye Distance:   ?Bilateral Distance:   ? ?Right Eye Near:   ?Left Eye Near:    ?Bilateral Near:    ? ?Physical Exam ?Vitals and nursing note reviewed.  ?Constitutional:   ?   Appearance: Normal appearance.  ?HENT:  ?   Head: Atraumatic.  ?Eyes:  ?   Extraocular Movements: Extraocular movements intact.  ?   Conjunctiva/sclera: Conjunctivae normal.  ?Cardiovascular:  ?   Rate and Rhythm: Normal rate and regular rhythm.  ?Pulmonary:  ?   Effort: Pulmonary effort is normal.  ?   Breath sounds: Normal breath sounds.  ?Musculoskeletal:     ?   General: Swelling and tenderness present. No deformity  or signs of injury. Normal range of motion.  ?   Cervical back: Normal range of motion and neck supple.  ?   Comments: Trace edema in area of tenderness left dorsal foot.  Range of motion intact.  No bony deformity palpable on exam.  Normal gait.  ?Skin: ?   General: Skin is warm and dry.  ?   Findings: No bruising or erythema.  ?Neurological:  ?   Mental Status: He is oriented to person, place, and time.  ?   Comments: Left foot neurovascularly intact  ?Psychiatric:     ?   Mood and Affect: Mood normal.     ?   Thought Content: Thought content normal.     ?   Judgment: Judgment normal.  ? ?UC Treatments / Results  ?Labs ?(all labs ordered are listed, but only abnormal results are displayed) ?Labs Reviewed - No data to display ? ?EKG ? ? ?Radiology ?DG Foot Complete Left ? ?Result Date: 09/23/2021 ?CLINICAL DATA:  Three weeks of dorsal left foot pain. EXAM: LEFT FOOT - COMPLETE 3+ VIEW COMPARISON:  None Available. FINDINGS: There is no evidence of fracture or dislocation. Mild dorsal midfoot degenerative change. Soft tissues are unremarkable. IMPRESSION: No fracture or dislocation of the left foot. Mild dorsal midfoot degenerative change. Electronically Signed   By: Maudry Mayhew M.D.   On: 09/23/2021 10:02   ? ?Procedures ?Procedures (including critical care time) ? ?Medications Ordered in UC ?Medications - No data to display ? ?Initial Impression / Assessment and Plan / UC Course  ?I have reviewed the triage vital signs and the nursing notes. ? ?Pertinent labs & imaging results that were available during my care of the patient were reviewed by me and considered in my medical decision making (see chart for details). ? ?  ? ? ?Left foot x-ray showing mild arthritic changes in the area of his pain.  We will treat with prednisone, RICE protocol, over-the-counter pain relievers and follow-up with podiatry if not fully resolving or if pain recurring. ? ?Final Clinical Impressions(s) / UC Diagnoses  ? ?Final diagnoses:   ?Left foot pain  ? ? ? ?Discharge Instructions   ? ?  ?[facility] ?[address] [place], [place] [address].  ?  Main: [phone]. ? ? ? ?ED Prescriptions   ? ? Medication Sig Dispense Auth. Provider  ? predniSONE (DELTASONE) 20 MG tablet Take 2 tablets (40 mg total) by mouth daily with breakfast. 10 tablet Particia NearingLane, Cory Rama Elizabeth, PA-C  ? ?  ? ?PDMP not reviewed this encounter. ?  ?Particia NearingLane, Lian Tanori Elizabeth, PA-C ?09/23/21 1014 ? ?

## 2021-09-23 NOTE — ED Triage Notes (Signed)
Pt states for the past 3 weeks his left foot is in pain and some swelling ? ?Pt does not remember injury ?

## 2021-09-23 NOTE — Discharge Instructions (Addendum)
[facility] [address] [place], [place] [address].  Main: [phone]. 

## 2022-07-02 ENCOUNTER — Ambulatory Visit: Payer: PRIVATE HEALTH INSURANCE | Admitting: Internal Medicine

## 2022-07-02 ENCOUNTER — Encounter: Payer: Self-pay | Admitting: Internal Medicine

## 2022-07-02 VITALS — BP 128/88 | HR 81 | Temp 98.1°F | Ht 69.0 in | Wt 242.2 lb

## 2022-07-02 DIAGNOSIS — Z87891 Personal history of nicotine dependence: Secondary | ICD-10-CM

## 2022-07-02 DIAGNOSIS — H9190 Unspecified hearing loss, unspecified ear: Secondary | ICD-10-CM

## 2022-07-02 DIAGNOSIS — E6609 Other obesity due to excess calories: Secondary | ICD-10-CM

## 2022-07-02 DIAGNOSIS — L219 Seborrheic dermatitis, unspecified: Secondary | ICD-10-CM | POA: Diagnosis not present

## 2022-07-02 DIAGNOSIS — Z119 Encounter for screening for infectious and parasitic diseases, unspecified: Secondary | ICD-10-CM | POA: Diagnosis not present

## 2022-07-02 DIAGNOSIS — H833X9 Noise effects on inner ear, unspecified ear: Secondary | ICD-10-CM

## 2022-07-02 DIAGNOSIS — M1990 Unspecified osteoarthritis, unspecified site: Secondary | ICD-10-CM

## 2022-07-02 DIAGNOSIS — Z0000 Encounter for general adult medical examination without abnormal findings: Secondary | ICD-10-CM

## 2022-07-02 HISTORY — DX: Unspecified hearing loss, unspecified ear: H91.90

## 2022-07-02 HISTORY — DX: Unspecified osteoarthritis, unspecified site: M19.90

## 2022-07-02 MED ORDER — KETOCONAZOLE 2 % EX CREA: 1.0000 | TOPICAL_CREAM | Freq: Two times a day (BID) | CUTANEOUS | 0 refills | Status: DC

## 2022-07-02 NOTE — Assessment & Plan Note (Signed)
Encouraged continuing with abstinence.

## 2022-07-02 NOTE — Assessment & Plan Note (Addendum)
Mostly top of foot Offered podiatry, physical therapy, XR, pain / NSAID medication - prefers ignore for now Flares with loading

## 2022-07-02 NOTE — Assessment & Plan Note (Signed)
Seems like he has had yearly declines in his hearing the last few years associated with workplace high-volume sounds I recommend he wear double hearing protection at work and be aggressive about it and see audiology and ear nose and throat to see if anything else can be done to protect his hearing or correct it

## 2022-07-02 NOTE — Progress Notes (Signed)
[facility]  Phone: [phone]  New patient visit  Visit Date: 07/02/2022 Patient: Phillip Shaw   DOB: 04-07-1986   37 y.o. Male  MRN: [id number]  Today's healthcare provider: Loralee Pacas, MD  Assessment and Plan:   Jakory was seen today for new patient (initial visit), facial dryness/flakes and hearing loss.  Seborrheic dermatitis Overview: Patients chart review and interview were used to generate a prompt for artificial intelligence analysis ([employer] artificial intelligence) clinical decision support.  AI output was reviewed and is provided in red:  Comprehensive Review of the Case: A 37 year old male presents with a chronic, non-itchy, flaky malar rash extending to the nose and medial eyebrow, sometimes flaring at the angles of the lips, persisting for 4 years. The patient has not sought treatment except for the use of basic lotion. The rash is described as dry, flaky, and minimally erythematous without associated systemic symptoms. There is no mention of demographics beyond age and sex, symptom onset and duration specifics beyond the 4-year timeline, site of care, geographic region and travel history, patient history including medical, surgical, psychosocial, family history, physical exam findings beyond the rash description, medications, or results of any diagnostic studies. Initial treatments and responses, beyond the use of basic lotion, are not provided.  Clinical Problem Representation: A 37 year old male with a chronic, non-itchy, flaky, and minimally erythematous malar rash extending to the nose and medial eyebrow, with episodic flares at the angles of the lips, persisting for 4 years without associated systemic symptoms or significant response to basic lotion.   Most Likely Diagnoses - Seborrheic Dermatitis: This condition is characterized by flaky, erythematous patches often located on the scalp, face, and chest, fitting the description of the patient's  rash. The chronic course, facial distribution, and lack of significant itching align with seborrheic dermatitis. The minimal response to basic lotion and the absence of systemic symptoms further support this diagnosis.  - Discoid Lupus Erythematosus (DLE): DLE is a chronic cutaneous condition presenting with erythematous, scaly plaques often on the face, scalp, and ears, which can lead to scarring. The patient's malar rash and its distribution are suggestive of DLE. However, the lack of systemic symptoms and the duration of the rash without progression to scarring or other systemic involvement makes this diagnosis less likely but still possible.  - Rosacea: Characterized by facial erythema, telangiectasia, and sometimes papules and pustules, rosacea can present with erythematous, flaky skin, particularly on the nose and cheeks. The chronicity and distribution of the rash could fit with rosacea, although the absence of other common features such as flushing and ocular symptoms makes this diagnosis less likely.   Expanded Differential Diagnoses - Psoriasis: Psoriasis can present with well-demarcated, erythematous plaques with silvery scales, typically on the elbows, knees, scalp, and trunk, but can also affect the face. The chronic nature and description of the rash could suggest facial psoriasis, although the typical locations and nail changes associated with psoriasis are not mentioned.  - Contact Dermatitis: This condition, resulting from exposure to irritants or allergens, can cause erythematous, flaky rashes. The localization to the malar area and angles of the lips could suggest exposure to a specific irritant or allergen, although the chronic course without mention of new products or exposures makes this less likely.  - Tinea Faciei: A fungal infection of the face that presents with erythematous, scaly patches. The chronicity and description of the rash could fit, especially if localized to areas not  typically exposed to sunlight. However, the lack  of pruritus and the distribution make this diagnosis less likely.   Can't Miss Differential Diagnosis Given the chronic, non-life-threatening presentation described, there are no immediate "can't miss" life-threatening conditions directly suggested by the rash itself. However, it's crucial to consider systemic conditions that could present initially with cutaneous manifestations, such as systemic lupus erythematosus (SLE), if systemic symptoms develop or if the rash evolves in a manner suggestive of a more systemic process.  Assessment & Plan  A 37 year old male presents with a chronic, non-itchy, flaky malar rash extending to the nose and medial eyebrow, occasionally flaring at the angles of the lips, persisting for 4 years. The patient has not sought medical treatment beyond the use of basic lotion, and reports the skin as dry and flaky, with minimal erythema. The absence of systemic symptoms such as fever, joint pain, photosensitivity, or oral ulcers narrows the differential. The distribution and chronicity of the rash, alongside the lack of pruritus, suggest a dermatological condition rather than a systemic inflammatory or infectious process.  Differential Diagnosis: 1. Seborrheic Dermatitis: Characterized by flaky, erythematous patches often found in sebum-rich areas. The chronic course and distribution are consistent with this diagnosis. 2. Discoid Lupus Erythematosus (DLE): A chronic cutaneous form of lupus erythematosus presenting as erythematous, scaly plaques, potentially leading to scarring. The involvement of the nose and eyebrows raises this possibility. 3. Rosacea: Typically presents with facial erythema, telangiectasia, and pustules, but can have a dry, flaky appearance. Less likely given the distribution and absence of flushing or pustules. 4. Psoriasis: Chronic autoimmune condition presenting with well-demarcated, scaly plaques. Facial  involvement is less common but possible.  Plan:  Seborrheic Dermatitis/Rosacea/DLE/Psoriasis - Dx:   - Dermatological Evaluation: A thorough skin examination to assess the distribution, morphology, and presence of any additional lesions not reported.   - Skin Biopsy: To differentiate between DLE, psoriasis, and other dermatoses if the clinical presentation is ambiguous.   - Blood Tests: ANA and dsDNA to rule out systemic lupus erythematosus, especially if DLE is suspected.   - Dermoscopy: May aid in distinguishing between rosacea, seborrheic dermatitis, and other dermatological conditions.  - Tx:   - Topical Steroids: Low to medium potency for seborrheic dermatitis or mild DLE lesions.   - Antifungal Creams/Shampoos: For seborrheic dermatitis, considering its association with Malassezia species.   - Topical Calcineurin Inhibitors: Such as tacrolimus or pimecrolimus, can be effective for seborrheic dermatitis and DLE without the risk of steroid atrophy.   - Hydroxychloroquine: For DLE, after a thorough ophthalmologic examination and with regular monitoring for medication side effects.   - Topical Metronidazole/Azelaic Acid: For rosacea, to reduce inflammation and erythema.   - Moisturizers and Gentle Skincare: To address dryness and flakiness, suitable for all differential diagnoses.  - Monitoring and Follow-Up:   - Follow-Up Appointment: In 4-6 weeks to assess response to treatment and adjust the management plan as necessary.   - Skin Care Education: Counseling on gentle skin care practices, sun protection (especially for DLE), and the importance of regular follow-up for monitoring potential progression to systemic involvement in the case of DLE.   - Referral to Dermatology: If the diagnosis remains uncertain, for persistent lesions not responding to initial therapy, or if systemic involvement is suspected.  This comprehensive approach ensures a meticulous evaluation and management plan  tailored to the differential diagnoses, optimizing patient outcomes through targeted therapy and close monitoring.  Assessment & Plan: Recommmend treating as just seborrheic dermatitis for now and let me know if it fails  The mainstays  of treatment for facial seborrheic dermatitis are topical antifungals, corticosteroids, and calcineurin inhibitors. Ketoconazole 2% is as effective as hydrocortisone 1% cream. Ketoconazole 2% gel (Xolegel) significantly reduced symptoms of erythema, pruritus, and scaling compared with vehicle alone.  Orders: -     Ketoconazole; Apply 1 Application topically 2 (two) times daily.  Dispense: 60 g; Refill: 0  Preventative health care -     CBC; Future -     Comprehensive metabolic panel; Future -     Lipid panel; Future  Screening examination for infectious disease -     Hepatitis C antibody; Future -     HIV Antibody (routine testing w rflx); Future  Obesity due to excess calories, unspecified classification, unspecified whether serious comorbidity present -     Hemoglobin A1c; Future -     TSH; Future  Arthritis Overview: Especially in left ankle/foot  Assessment & Plan: Mostly top of foot Offered podiatry, physical therapy, XR, pain / NSAID medication - prefers ignore for now Flares with loading   History of smoking 10-25 pack years Overview: Approximately 1ppd x 15 years stopping about 2016  Assessment & Plan: Encouraged continuing with abstinence.   Noise-induced hearing loss, unspecified laterality Assessment & Plan: Seems like he has had yearly declines in his hearing the last few years associated with workplace high-volume sounds I recommend he wear double hearing protection at work and be aggressive about it and see audiology and ear nose and throat to see if anything else can be done to protect his hearing or correct it  Orders: -     Ambulatory referral to Audiology -     Ambulatory referral to ENT      Subjective:  Patient  presents today to establish care.  Chief Complaint  Patient presents with   New Patient (Initial Visit)   Facial dryness/flakes    For a few years.   Hearing Loss    Has decreased.    Problem-oriented charting was used to develop and update his medical history: Problem  Obesity Due to Excess Calories  Arthritis   Especially in left ankle/foot   History of Smoking 10-25 Pack Years   Approximately 1ppd x 15 years stopping about 2016   Hearing Loss  Seborrheic Dermatitis   Patients chart review and interview were used to generate a prompt for artificial intelligence analysis ([employer] artificial intelligence) clinical decision support.  AI output was reviewed and is provided in red:  Comprehensive Review of the Case: A 37 year old male presents with a chronic, non-itchy, flaky malar rash extending to the nose and medial eyebrow, sometimes flaring at the angles of the lips, persisting for 4 years. The patient has not sought treatment except for the use of basic lotion. The rash is described as dry, flaky, and minimally erythematous without associated systemic symptoms. There is no mention of demographics beyond age and sex, symptom onset and duration specifics beyond the 4-year timeline, site of care, geographic region and travel history, patient history including medical, surgical, psychosocial, family history, physical exam findings beyond the rash description, medications, or results of any diagnostic studies. Initial treatments and responses, beyond the use of basic lotion, are not provided.  Clinical Problem Representation: A 37 year old male with a chronic, non-itchy, flaky, and minimally erythematous malar rash extending to the nose and medial eyebrow, with episodic flares at the angles of the lips, persisting for 4 years without associated systemic symptoms or significant response to basic lotion.   Most Likely Diagnoses - Seborrheic Dermatitis:  This condition is characterized by  flaky, erythematous patches often located on the scalp, face, and chest, fitting the description of the patient's rash. The chronic course, facial distribution, and lack of significant itching align with seborrheic dermatitis. The minimal response to basic lotion and the absence of systemic symptoms further support this diagnosis.  - Discoid Lupus Erythematosus (DLE): DLE is a chronic cutaneous condition presenting with erythematous, scaly plaques often on the face, scalp, and ears, which can lead to scarring. The patient's malar rash and its distribution are suggestive of DLE. However, the lack of systemic symptoms and the duration of the rash without progression to scarring or other systemic involvement makes this diagnosis less likely but still possible.  - Rosacea: Characterized by facial erythema, telangiectasia, and sometimes papules and pustules, rosacea can present with erythematous, flaky skin, particularly on the nose and cheeks. The chronicity and distribution of the rash could fit with rosacea, although the absence of other common features such as flushing and ocular symptoms makes this diagnosis less likely.   Expanded Differential Diagnoses - Psoriasis: Psoriasis can present with well-demarcated, erythematous plaques with silvery scales, typically on the elbows, knees, scalp, and trunk, but can also affect the face. The chronic nature and description of the rash could suggest facial psoriasis, although the typical locations and nail changes associated with psoriasis are not mentioned.  - Contact Dermatitis: This condition, resulting from exposure to irritants or allergens, can cause erythematous, flaky rashes. The localization to the malar area and angles of the lips could suggest exposure to a specific irritant or allergen, although the chronic course without mention of new products or exposures makes this less likely.  - Tinea Faciei: A fungal infection of the face that presents with  erythematous, scaly patches. The chronicity and description of the rash could fit, especially if localized to areas not typically exposed to sunlight. However, the lack of pruritus and the distribution make this diagnosis less likely.   Can't Miss Differential Diagnosis Given the chronic, non-life-threatening presentation described, there are no immediate "can't miss" life-threatening conditions directly suggested by the rash itself. However, it's crucial to consider systemic conditions that could present initially with cutaneous manifestations, such as systemic lupus erythematosus (SLE), if systemic symptoms develop or if the rash evolves in a manner suggestive of a more systemic process.  Assessment & Plan  A 37 year old male presents with a chronic, non-itchy, flaky malar rash extending to the nose and medial eyebrow, occasionally flaring at the angles of the lips, persisting for 4 years. The patient has not sought medical treatment beyond the use of basic lotion, and reports the skin as dry and flaky, with minimal erythema. The absence of systemic symptoms such as fever, joint pain, photosensitivity, or oral ulcers narrows the differential. The distribution and chronicity of the rash, alongside the lack of pruritus, suggest a dermatological condition rather than a systemic inflammatory or infectious process.  Differential Diagnosis: 1. Seborrheic Dermatitis: Characterized by flaky, erythematous patches often found in sebum-rich areas. The chronic course and distribution are consistent with this diagnosis. 2. Discoid Lupus Erythematosus (DLE): A chronic cutaneous form of lupus erythematosus presenting as erythematous, scaly plaques, potentially leading to scarring. The involvement of the nose and eyebrows raises this possibility. 3. Rosacea: Typically presents with facial erythema, telangiectasia, and pustules, but can have a dry, flaky appearance. Less likely given the distribution and absence of  flushing or pustules. 4. Psoriasis: Chronic autoimmune condition presenting with well-demarcated, scaly plaques. Facial involvement is less common  but possible.  Plan:  Seborrheic Dermatitis/Rosacea/DLE/Psoriasis - Dx:   - Dermatological Evaluation: A thorough skin examination to assess the distribution, morphology, and presence of any additional lesions not reported.   - Skin Biopsy: To differentiate between DLE, psoriasis, and other dermatoses if the clinical presentation is ambiguous.   - Blood Tests: ANA and dsDNA to rule out systemic lupus erythematosus, especially if DLE is suspected.   - Dermoscopy: May aid in distinguishing between rosacea, seborrheic dermatitis, and other dermatological conditions.  - Tx:   - Topical Steroids: Low to medium potency for seborrheic dermatitis or mild DLE lesions.   - Antifungal Creams/Shampoos: For seborrheic dermatitis, considering its association with Malassezia species.   - Topical Calcineurin Inhibitors: Such as tacrolimus or pimecrolimus, can be effective for seborrheic dermatitis and DLE without the risk of steroid atrophy.   - Hydroxychloroquine: For DLE, after a thorough ophthalmologic examination and with regular monitoring for medication side effects.   - Topical Metronidazole/Azelaic Acid: For rosacea, to reduce inflammation and erythema.   - Moisturizers and Gentle Skincare: To address dryness and flakiness, suitable for all differential diagnoses.  - Monitoring and Follow-Up:   - Follow-Up Appointment: In 4-6 weeks to assess response to treatment and adjust the management plan as necessary.   - Skin Care Education: Counseling on gentle skin care practices, sun protection (especially for DLE), and the importance of regular follow-up for monitoring potential progression to systemic involvement in the case of DLE.   - Referral to Dermatology: If the diagnosis remains uncertain, for persistent lesions not responding to initial therapy, or if  systemic involvement is suspected.  This comprehensive approach ensures a meticulous evaluation and management plan tailored to the differential diagnoses, optimizing patient outcomes through targeted therapy and close monitoring.      Depression Screen    07/02/2022    3:50 PM  PHQ 2/9 Scores  PHQ - 2 Score 0   No results found for any visits on 07/02/22.  The following were reviewed and entered/updated into his MEDICAL RECORD [facility] History:  Diagnosis Date   Arthritis 07/02/2022   Especially in left ankle/foot   Hearing loss 07/02/2022   Past Surgical History:  Procedure Laterality Date   WISDOM TOOTH EXTRACTION     Family History  Problem Relation Age of Onset   Early death Mother    Drug abuse Mother    Diabetes Mother    Depression Mother    Cancer Mother    Early death Sister    Cancer Sister    Cancer Maternal Grandmother    Arthritis Maternal Grandmother    Cancer Maternal Grandfather    Cancer Other    Diabetes Other    Outpatient Medications Prior to Visit  Medication Sig Dispense Refill   albuterol (VENTOLIN HFA) 108 (90 Base) MCG/ACT inhaler Inhale 2 puffs into the lungs every 6 (six) hours as needed for wheezing or shortness of breath. 8 g 0   cetirizine (ZYRTEC) 10 MG tablet Take 1 tablet (10 mg total) by mouth daily. 30 tablet 0   fluticasone (FLONASE) 50 MCG/ACT nasal spray Place 2 sprays into both nostrils daily. (Patient not taking: Reported on 07/02/2022) 16 g 0   HYDROcodone-acetaminophen (NORCO/VICODIN) 5-325 MG tablet Take 1 tablet by mouth every 4 (four) hours as needed. (Patient not taking: Reported on 07/02/2022) 20 tablet 0   ibuprofen (ADVIL,MOTRIN) 200 MG tablet Take 800 mg by mouth every 6 (six) hours as needed for moderate pain. (Patient not taking:  Reported on 07/02/2022)     ofloxacin (OCUFLOX) 0.3 % ophthalmic solution Place 1 drop into the left eye 4 (four) times daily. (Patient not taking: Reported on 07/02/2022) 5 mL 0    predniSONE (DELTASONE) 20 MG tablet Take 2 tablets (40 mg total) by mouth daily with breakfast. (Patient not taking: Reported on 07/02/2022) 10 tablet 0   No facility-administered medications prior to visit.    No Known Allergies Social History   Tobacco Use   Smoking status: Former    Packs/day: 1.00    Years: 14.00    Total pack years: 14.00    Types: Cigarettes    Quit date: 05/02/2015    Years since quitting: 7.1   Smokeless tobacco: Never  Vaping Use   Vaping Use: Never used  Substance Use Topics   Alcohol use: Not Currently    Alcohol/week: 12.0 standard drinks of alcohol    Types: 12 Cans of beer per week    Comment: socially   Drug use: Not Currently    Types: Marijuana    Comment: occasional     There is no immunization history on file for this patient.  Objective:  BP 128/88 (BP Location: Left Arm, Patient Position: Sitting)   Pulse 81   Temp 98.1 F (36.7 C) (Temporal)   Ht 5' 9"$  (1.753 m)   Wt 242 lb 3.2 oz (109.9 kg)   SpO2 98%   BMI 35.77 kg/m  His Body mass index is 35.77 kg/m. indicates that he is Obese male , but waist circumference (truncal adiposity) is a better indicator of healthy body composition. He has  severity: moderate truncal adiposity in my medical opinion.  Vital signs reviewed.  Physical Exam  Wt Readings from Last 10 Encounters:  07/02/22 242 lb 3.2 oz (109.9 kg)  11/30/19 250 lb (113.4 kg)  12/10/15 250 lb (113.4 kg)  12/13/14 237 lb (107.5 kg)  11/03/12 222 lb (100.7 kg)   Weight trend (if available above) reviewed. General Appearance/Constitutional:  polite male in no acute distress Musculoskeletal: All extremities are intact.  Neurological:  Awake, alert,  No obvious focal neurological deficits or cognitive impairments Psychiatric:  Appropriate mood, pleasant demeanor Problem-specific findings:  flaky, mildly erythematous malar rash involving mainly just the epidermal skin layers of face  Results Reviewed: Results for  orders placed or performed during the hospital encounter of 09/11/21  Coronavirus (COVID-19) with Influenza A and Influenza B   Specimen: Nasopharyngeal   Naso  Result Value Ref Range   SARS-CoV-2, NAA Not Detected Not Detected   Influenza A, NAA Not Detected Not Detected   Influenza B, NAA Not Detected Not Detected   Test Information: Comment     Any labs added to problem overview.

## 2022-07-02 NOTE — Assessment & Plan Note (Signed)
Recommmend treating as just seborrheic dermatitis for now and let me know if it fails  The mainstays of treatment for facial seborrheic dermatitis are topical antifungals, corticosteroids, and calcineurin inhibitors. Ketoconazole 2% is as effective as hydrocortisone 1% cream. Ketoconazole 2% gel (Xolegel) significantly reduced symptoms of erythema, pruritus, and scaling compared with vehicle alone.

## 2022-07-10 ENCOUNTER — Ambulatory Visit: Payer: PRIVATE HEALTH INSURANCE | Admitting: Audiology

## 2022-07-14 ENCOUNTER — Other Ambulatory Visit (INDEPENDENT_AMBULATORY_CARE_PROVIDER_SITE_OTHER): Payer: PRIVATE HEALTH INSURANCE

## 2022-07-14 DIAGNOSIS — E6609 Other obesity due to excess calories: Secondary | ICD-10-CM | POA: Diagnosis not present

## 2022-07-14 DIAGNOSIS — Z0000 Encounter for general adult medical examination without abnormal findings: Secondary | ICD-10-CM | POA: Diagnosis not present

## 2022-07-14 DIAGNOSIS — Z119 Encounter for screening for infectious and parasitic diseases, unspecified: Secondary | ICD-10-CM

## 2022-07-14 LAB — COMPREHENSIVE METABOLIC PANEL
ALT: 16 U/L (ref 0–53)
AST: 14 U/L (ref 0–37)
Albumin: 4.2 g/dL (ref 3.5–5.2)
Alkaline Phosphatase: 58 U/L (ref 39–117)
BUN: 12 mg/dL (ref 6–23)
CO2: 28 mEq/L (ref 19–32)
Calcium: 9.6 mg/dL (ref 8.4–10.5)
Chloride: 104 mEq/L (ref 96–112)
Creatinine, Ser: 0.96 mg/dL (ref 0.40–1.50)
GFR: 100.05 mL/min (ref >60.00)
Glucose, Bld: 104 mg/dL — ABNORMAL HIGH (ref 70–99)
Potassium: 4.2 mEq/L (ref 3.5–5.1)
Sodium: 141 mEq/L (ref 135–145)
Total Bilirubin: 0.6 mg/dL (ref 0.2–1.2)
Total Protein: 6.8 g/dL (ref 6.0–8.3)

## 2022-07-14 LAB — TSH: TSH: 1.51 u[IU]/mL (ref 0.35–5.50)

## 2022-07-14 LAB — LIPID PANEL
Cholesterol: 172 mg/dL (ref 0–200)
HDL: 49.3 mg/dL (ref >39.00)
LDL Cholesterol: 99 mg/dL (ref 0–99)
NonHDL: 123.06
Total CHOL/HDL Ratio: 3
Triglycerides: 119 mg/dL (ref 0.0–149.0)
VLDL: 23.8 mg/dL (ref 0.0–40.0)

## 2022-07-14 LAB — CBC
HCT: 46 % (ref 39.0–52.0)
Hemoglobin: 16.3 g/dL (ref 13.0–17.0)
MCHC: 35.6 g/dL (ref 30.0–36.0)
MCV: 89.4 fl (ref 78.0–100.0)
Platelets: 243 10*3/uL (ref 150.0–400.0)
RBC: 5.14 Mil/uL (ref 4.22–5.81)
RDW: 13.8 % (ref 11.5–15.5)
WBC: 4.8 10*3/uL (ref 4.0–10.5)

## 2022-07-14 LAB — HEMOGLOBIN A1C: Hgb A1c MFr Bld: 4.9 % (ref 4.6–6.5)

## 2022-07-15 LAB — HIV ANTIBODY (ROUTINE TESTING W REFLEX): HIV 1&2 Ab, 4th Generation: NONREACTIVE

## 2022-07-15 LAB — HEPATITIS C ANTIBODY: Hepatitis C Ab: NONREACTIVE

## 2022-07-21 ENCOUNTER — Encounter: Payer: Self-pay | Admitting: Internal Medicine

## 2022-07-21 ENCOUNTER — Ambulatory Visit (INDEPENDENT_AMBULATORY_CARE_PROVIDER_SITE_OTHER): Payer: PRIVATE HEALTH INSURANCE | Admitting: Internal Medicine

## 2022-07-21 VITALS — BP 130/82 | HR 83 | Temp 98.0°F | Ht 69.0 in | Wt 244.0 lb

## 2022-07-21 DIAGNOSIS — Z0001 Encounter for general adult medical examination with abnormal findings: Secondary | ICD-10-CM | POA: Diagnosis not present

## 2022-07-21 DIAGNOSIS — R0683 Snoring: Secondary | ICD-10-CM | POA: Diagnosis not present

## 2022-07-21 DIAGNOSIS — H8090 Unspecified otosclerosis, unspecified ear: Secondary | ICD-10-CM | POA: Insufficient documentation

## 2022-07-21 DIAGNOSIS — Z23 Encounter for immunization: Secondary | ICD-10-CM | POA: Diagnosis not present

## 2022-07-21 DIAGNOSIS — H8093 Unspecified otosclerosis, bilateral: Secondary | ICD-10-CM

## 2022-07-21 DIAGNOSIS — J320 Chronic maxillary sinusitis: Secondary | ICD-10-CM

## 2022-07-21 DIAGNOSIS — J329 Chronic sinusitis, unspecified: Secondary | ICD-10-CM | POA: Insufficient documentation

## 2022-07-21 MED ORDER — TETANUS-DIPHTH-ACELL PERTUSSIS 5-2.5-18.5 LF-MCG/0.5 IM SUSY: PREFILLED_SYRINGE | INTRAMUSCULAR | 0 refills | Status: DC

## 2022-07-21 NOTE — Progress Notes (Signed)
[place] [facility] [employer]: [phone] Provider: Loralee Pacas, MD   Chief Complaint:  Phillip Shaw is a 37 y.o. assigned male at birth who presents today for his annual comprehensive physical exam.     Assessment/Plan:   Phillip Shaw was seen today for annual exam.  Encounter for annual general medical examination with abnormal findings in adult  Snoring  Otosclerosis of both ears  Chronic maxillary sinusitis  Need for Tdap vaccination -     Tdap vaccine greater than or equal to 7yo IM      Today's Health Maintenance Counseling and Anticipatory Guidance:   Eye exams:  every 1-2 years recommended.  Having vision corrected can improve the quality of day-to-day life.  Eye specialists can detect certain eye conditions such as cataracts, glaucoma and age-related macular degeneration, which could lead to sight loss.  They can also detect certain rare cancers and diabetes, among other things.  Phillip Shaw reports his last eye exam was: around 10 years ago Dental health: Discussed importance of regular tooth brushing, flossing, and dental visits q6 months.  Poor dentition can lead to serious medical problems - particularly problems with heart valves.  Phillip Shaw reports he has been keeping up with his dental visits.  He intends to do so in the future.  Sinus health: Encourage sterile saline nasal misting sinus rinses daily for pollen, to reduce allergies and risk for sinus infections.   Sterile can based misting products are recommended due to superior misting and ease of maintaining sterility. Sleep Apnea screening:  He denies any significant problems with sleep quality or hypersomnolence or being advised that he has been told that he snores or has apnea STOP-Bang Score Do you snore loudly?: Yes Do you often feel tired, fatigued, or sleepy during the daytime?: Yes Has anyone observed you stop breathing during sleep?: No, just wheezing Do you have (or are you being treated  for) high blood pressure?:  No BMI    Body Mass Index: 36.03 kg/m    @ Is BMI greater than 35 kg/m2?:  Yes Age older than 39 years old?: No Has large neck size > 40 cm (15.7 in, large male shirt size, large male collar size > 16): Yes Gender Male?:  Yes STOP-Bang Total Score(total yes answers):  4  -declined sleep referral  Cardiovascular Risk Factor Reduction:   Advised patient of need for regular exercise and diet rich and fruits and vegetables and healthy fats to reduce risk of heart attack and stroke.  Avoid first- and second-hand smoke and stimulants.   Avoid extreme exercise- exercise in moderation (150 minutes per week is a good goal) Wt Readings from Last 3 Encounters:  07/21/22 244 lb (110.7 kg)  07/02/22 242 lb 3.2 oz (109.9 kg)  11/30/19 250 lb (113.4 kg)   Body mass index is 36.03 kg/m. /  He reports his diet consists of wife cooks, ingredient household, mostly ingrredient food, not freezer food.  Usually does diet - encouraged zero beverages. 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 Health maintenance and immunizations reviewed and he was encouraged to complete anything that is due: Immunization History  Administered Date(s) Administered   Tdap 07/21/2022  Agreed to Tetanus, Diphtheria, and Pertussis (Tdap) today  There are no preventive care reminders to display for this patient.   Sexual transmitted infection screening: testing offered today, but patient declined as he feels he is low risk based on his sexual history    Substance use:  I discussed that my recommendation is total abstinence from all substances of abuse including smoke and 2nd hand smoke, alcohol, illicit drugs, smoking, inhalants, sugar.   Offered to assist with any use disorders or addictions.   Injury prevention: Discussed safety belts, safety helmets, smoke detectors. Cancer Screening: Penile cancer screening: Asked about genital warts or tumors/abnormalities of penis.  Recommended Gardasil if none present, or cryo ablation if present Testicular cancer screening:  Patient was advised to palpate testicles, scrotum, penis for masses and inform me of any.   Thyroid cancer screening: patient advised to check by palpating thyroid for nodules Prostate cancer screening:  Denies family history of prostate cancer or hematospermia so too young for screening by current guidelines  Colon cancer screening:    Denies strong family history of colon cancer or blood in stool so no screening is indicated until age 30.   Lung cancer screening: smoked 1 ppd x 13 years Skin cancer screening-  Advised regular sunscreen use. He denies worrisome, changing, or new skin lesions. Showed him pictures of melanomas for reference:     Return to care in 1 year for next preventative visit.      Loralee Pacas, MD     Subjective:   See problem-oriented charting in (overview sections of assessment & plan) for updated chart information added to chronic problems for future tracking  He has no acute complaints today.   Review of Systems  Constitutional:  Negative for chills, diaphoresis, fever, malaise/fatigue and weight loss.  HENT:  Positive for hearing loss. Negative for congestion, ear discharge, ear pain, nosebleeds, sinus pain, sore throat and tinnitus.   Eyes:  Negative for blurred vision, double vision, photophobia, pain, discharge and redness.  Respiratory:  Negative for cough, hemoptysis, sputum production, shortness of breath, wheezing and stridor.   Cardiovascular:  Negative for chest pain, palpitations, orthopnea, claudication, leg swelling and PND.  Gastrointestinal:  Positive for abdominal pain (rare intermittent). Negative for blood in stool, constipation, diarrhea, heartburn, melena, nausea and vomiting.  Genitourinary:  Negative for dysuria, flank pain, frequency, hematuria and urgency.  Musculoskeletal:  Positive for back pain and myalgias. Negative for falls, joint  pain and neck pain.  Skin:  Negative for itching and rash.  Neurological:  Negative for dizziness, tingling, tremors, sensory change, speech change, focal weakness, seizures, loss of consciousness, weakness and headaches.  Endo/Heme/Allergies:  Negative for environmental allergies and polydipsia. Does not bruise/bleed easily.  Psychiatric/Behavioral:  Negative for depression, hallucinations, memory loss, substance abuse and suicidal ideas. The patient is not nervous/anxious and does not have insomnia.      I attest that I have reviewed and confirmed the patients current medications to meet the medication reconciliation requirement  Current Outpatient Medications  Medication Sig Dispense Refill   albuterol (VENTOLIN HFA) 108 (90 Base) MCG/ACT inhaler Inhale 2 puffs into the lungs every 6 (six) hours as needed for wheezing or shortness of breath. 8 g 0   ketoconazole (NIZORAL) 2 % cream Apply 1 Application topically 2 (two) times daily. 60 g 0   No current facility-administered medications for this visit.    The following were reviewed and entered/updated in epic:    07/21/2022    3:17 PM  Depression screen PHQ 2/9  Decreased Interest 0  Down, Depressed, Hopeless 0  PHQ - 2 Score 0  Altered sleeping 1  Tired, decreased energy 1  Change in appetite 0  Feeling bad or failure about yourself  0  Trouble concentrating 3  Moving slowly or fidgety/restless 1  Suicidal thoughts 0  PHQ-9 Score 6  Difficult doing work/chores Somewhat difficult   Past Medical History:  Diagnosis Date   Arthritis 07/02/2022   Especially in left ankle/foot   Hearing loss 07/02/2022   Patient Active Problem List   Diagnosis Date Noted   Snoring 07/21/2022   Otosclerosis 07/21/2022   Chronic sinusitis 07/21/2022   Obesity due to excess calories 07/02/2022   Arthritis 07/02/2022   History of smoking 10-25 pack years 07/02/2022   Hearing loss 07/02/2022   Seborrheic dermatitis 07/02/2022   Past  Surgical History:  Procedure Laterality Date   WISDOM TOOTH EXTRACTION     Family History  Problem Relation Age of Onset   Early death Mother    Drug abuse Mother    Diabetes Mother    Depression Mother    Cancer Mother    Early death Sister    Cancer Sister    Cancer Maternal Grandmother    Arthritis Maternal Grandmother    Cancer Maternal Grandfather    Cancer Other    Diabetes Other    No Known Allergies Social History   Tobacco Use   Smoking status: Former    Packs/day: 1.00    Years: 14.00    Total pack years: 14.00    Types: Cigarettes    Quit date: 05/02/2015    Years since quitting: 7.2   Smokeless tobacco: Never  Vaping Use   Vaping Use: Never used  Substance Use Topics   Alcohol use: Not Currently    Alcohol/week: 12.0 standard drinks of alcohol    Types: 12 Cans of beer per week    Comment: socially   Drug use: Not Currently    Types: Marijuana    Comment: occasional           Objective:  Physical Exam: BP 130/82 (BP Location: Left Arm, Patient Position: Sitting)   Pulse 83   Temp 98 F (36.7 C) (Temporal)   Ht '5\' 9"'$  (1.753 m)   Wt 244 lb (110.7 kg)   SpO2 97%   BMI 36.03 kg/m   Body mass index is 36.03 kg/m. Wt Readings from Last 3 Encounters:  07/21/22 244 lb (110.7 kg)  07/02/22 242 lb 3.2 oz (109.9 kg)  11/30/19 250 lb (113.4 kg)   Gen: NAD, resting comfortably HEENT: TMs scarred white opaque bilaterally. OP clear. No thyromegaly noted.  CV: RRR with no murmurs appreciated Pulm: NWOB, CTAB with no crackles, wheezes, or rhonchi GI: Normal bowel sounds present. Soft, Nontender, Nondistended. MSK: no edema, cyanosis, or clubbing noted Skin: warm, dry Neuro: CN2-12 grossly intact. Strength 5/5 in upper and lower extremities. Reflexes symmetric and 1/5t bilaterally.  Psych: Normal affect and thought content

## 2022-07-21 NOTE — Patient Instructions (Addendum)
Recommend SnoreLab App and if you stop breathing, call for sleep referral if snoring bad or you stop breathing Encourage eye exam Encourage stop sugary beverages Encourage weight loss

## 2022-07-22 ENCOUNTER — Ambulatory Visit: Payer: PRIVATE HEALTH INSURANCE | Attending: Audiology | Admitting: Audiologist

## 2022-09-23 IMAGING — DX DG FOOT COMPLETE 3+V*L*
3 series · 3 of 3 positions shown · non-contrast
Comparison: None Available.

CLINICAL DATA: Three weeks of dorsal left foot pain.

EXAM:
LEFT FOOT - COMPLETE 3+ VIEW

[foot ap]
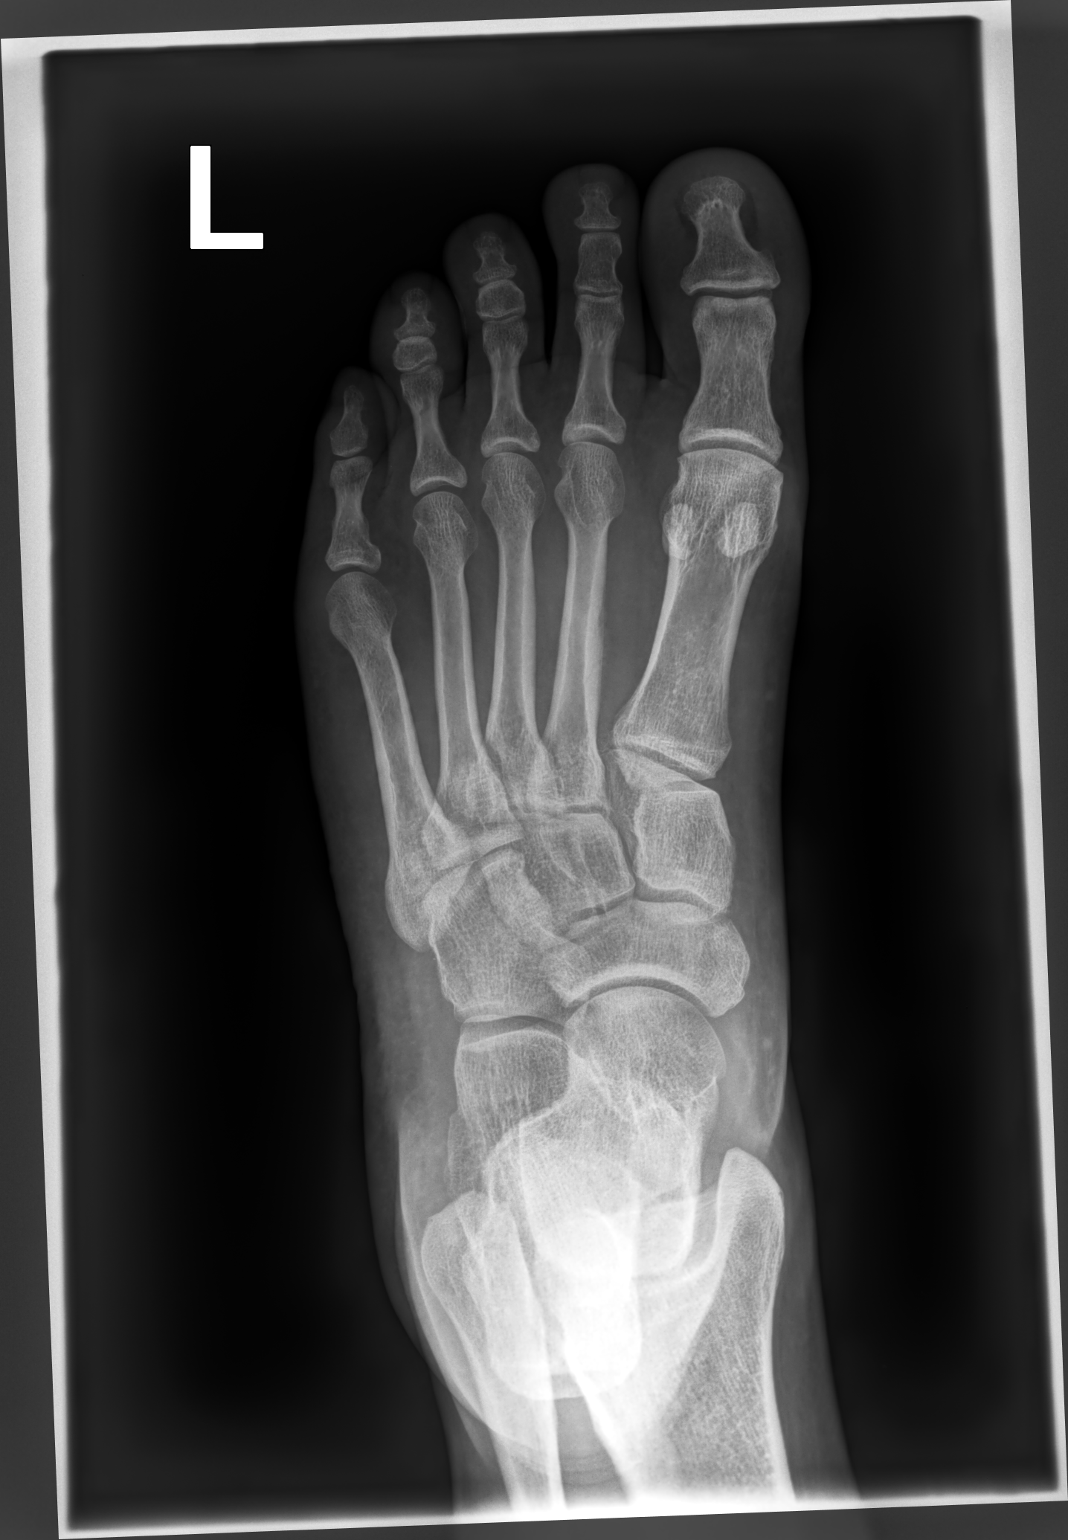

[foot mlo]
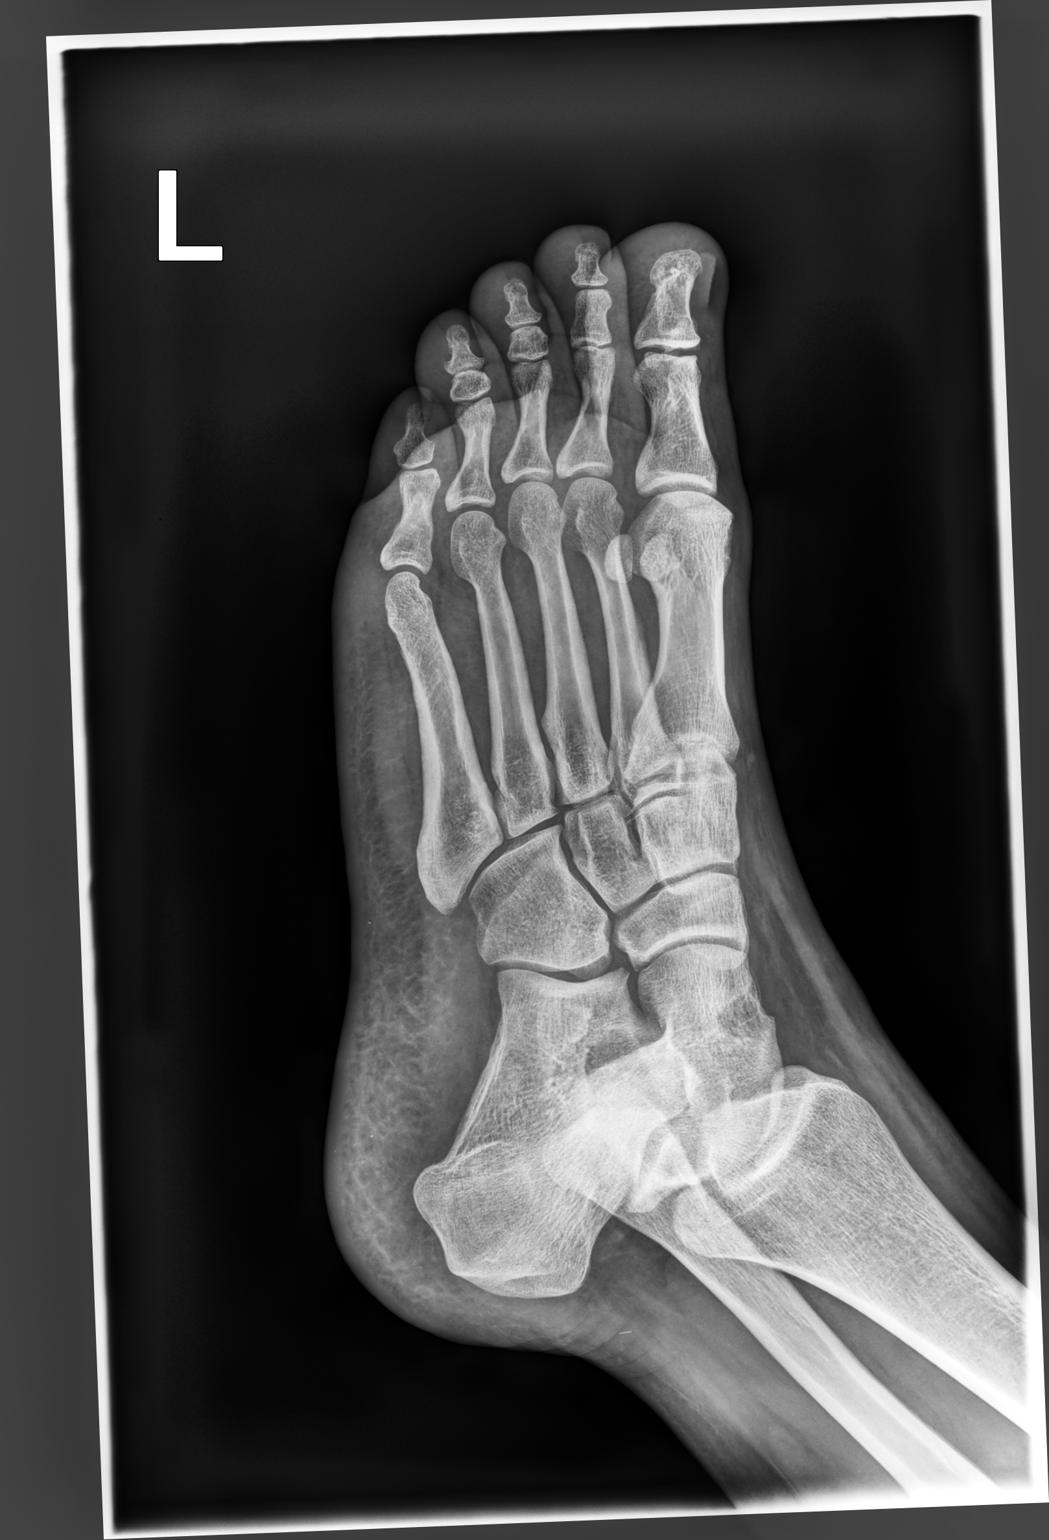

[foot lat]
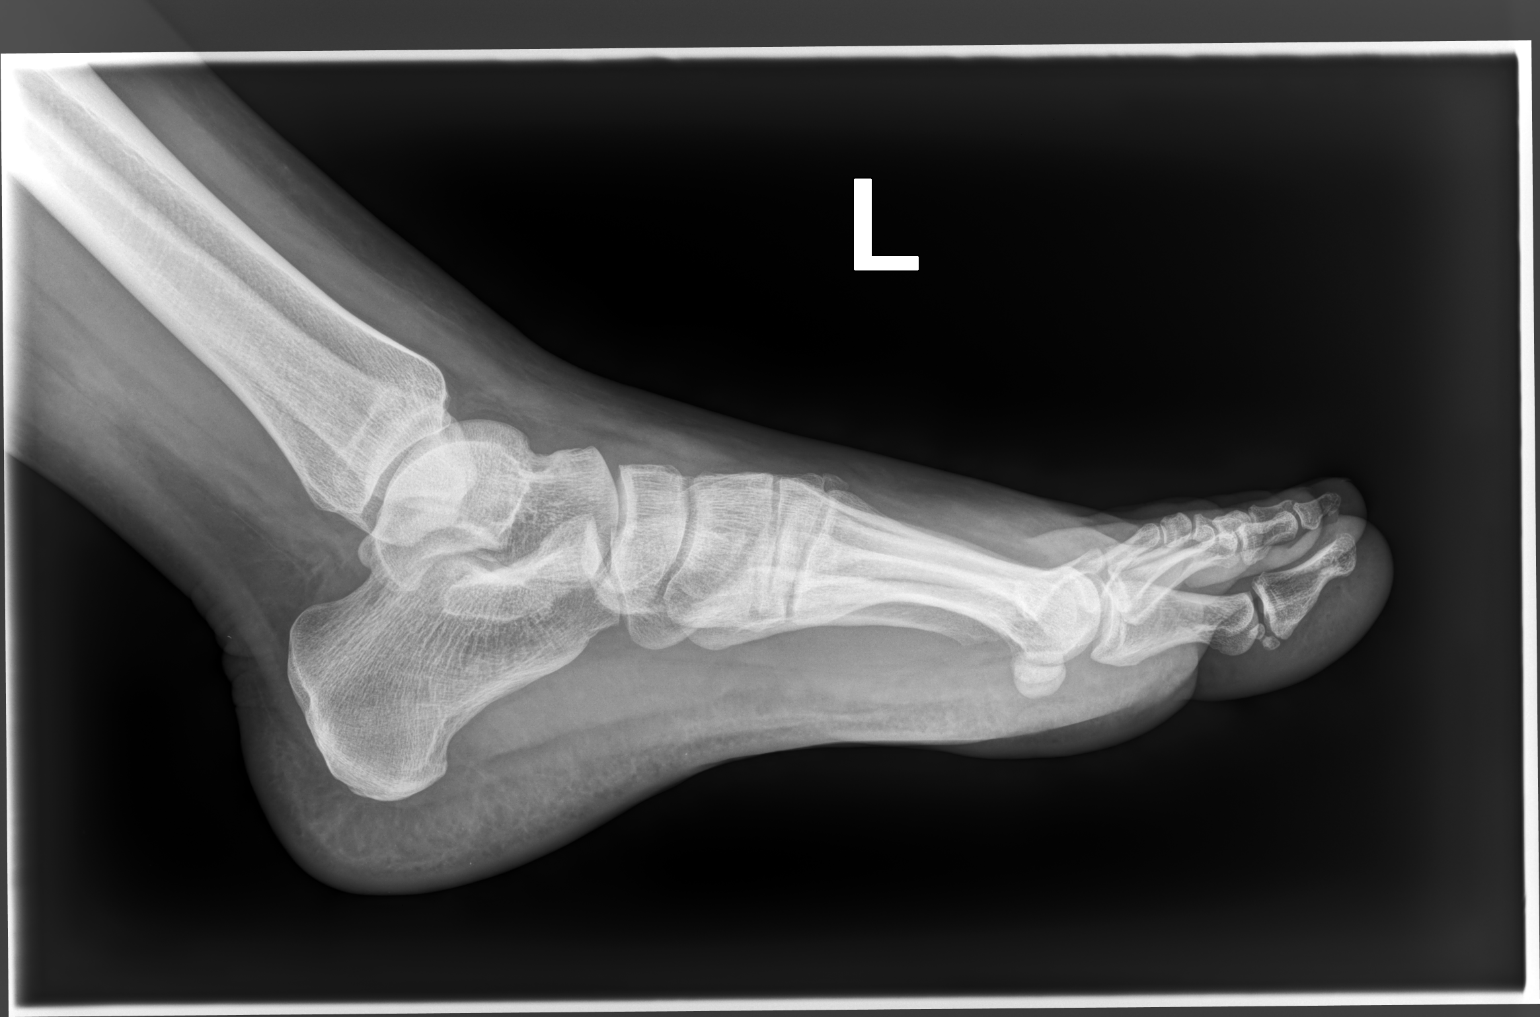

[3 of 3 positions shown; findings below may reference images not displayed]

FINDINGS: There is no evidence of fracture or dislocation. Mild dorsal midfoot
degenerative change. Soft tissues are unremarkable.
IMPRESSION: No fracture or dislocation of the left foot. Mild dorsal midfoot
degenerative change.

## 2023-07-22 ENCOUNTER — Encounter: Payer: Self-pay | Admitting: Internal Medicine

## 2023-07-22 ENCOUNTER — Ambulatory Visit (INDEPENDENT_AMBULATORY_CARE_PROVIDER_SITE_OTHER): Payer: PRIVATE HEALTH INSURANCE | Admitting: Internal Medicine

## 2023-07-22 VITALS — BP 123/81 | HR 87 | Temp 97.8°F | Ht 69.0 in | Wt 270.8 lb

## 2023-07-22 DIAGNOSIS — R400 Somnolence: Secondary | ICD-10-CM

## 2023-07-22 DIAGNOSIS — L219 Seborrheic dermatitis, unspecified: Secondary | ICD-10-CM

## 2023-07-22 DIAGNOSIS — E66812 Obesity, class 2: Secondary | ICD-10-CM | POA: Diagnosis not present

## 2023-07-22 DIAGNOSIS — R29818 Other symptoms and signs involving the nervous system: Secondary | ICD-10-CM | POA: Diagnosis not present

## 2023-07-22 DIAGNOSIS — R110 Nausea: Secondary | ICD-10-CM

## 2023-07-22 DIAGNOSIS — Z6839 Body mass index (BMI) 39.0-39.9, adult: Secondary | ICD-10-CM

## 2023-07-22 MED ORDER — ONDANSETRON 4 MG PO TBDP: 4.0000 mg | ORAL_TABLET | Freq: Three times a day (TID) | ORAL | 2 refills | Status: DC | PRN

## 2023-07-22 MED ORDER — TIRZEPATIDE-WEIGHT MANAGEMENT 2.5 MG/0.5ML ~~LOC~~ SOAJ: 2.5000 mg | SUBCUTANEOUS | 1 refills | Status: DC

## 2023-07-22 MED ORDER — KETOCONAZOLE 2 % EX CREA: 1.0000 | TOPICAL_CREAM | Freq: Two times a day (BID) | CUTANEOUS | 0 refills | Status: AC

## 2023-07-22 MED ORDER — TIRZEPATIDE-WEIGHT MANAGEMENT 2.5 MG/0.5ML ~~LOC~~ SOAJ: 2.5000 mg | SUBCUTANEOUS | 11 refills | Status: DC

## 2023-07-22 NOTE — Progress Notes (Signed)
 ==============================  [facility]: [phone]   -- Medical Office Visit --  Patient: Phillip Shaw      Age: 38 y.o.       Sex:  male  Date:   07/22/2023 Today's Healthcare Provider: Lula Olszewski, MD  ==============================   CHIEF COMPLAINT: Annual Exam Was initially planned but he preferred to focus on other issues instead  SUBJECTIVE: Background This is a 38 y.o. male who has Obesity due to excess calories; Arthritis; History of smoking 10-25 pack years; Hearing loss; Seborrheic dermatitis; Snoring; Otosclerosis; and Chronic sinusitis on their problem list.  History of Present Illness He experiences significant fatigue and daytime sleepiness, often taking naps during the day. Despite going to bed around 11 PM to 12 AM, he still feels tired. These symptoms have persisted for about two months, with a noticeable worsening over the last couple of weeks. He recalls a previous discussion about sleep apnea and describes symptoms such as snoring that sometimes wakes him up before he falls asleep. He also notes experiencing vivid dreams during short naps. He has gained approximately 15 to 20 pounds since his last visit, attributing some of the weight gain to staying home more and changes in physical activity. His wife has noticed his increased tiredness and attributes some of his sleep issues to weight gain.  He has a history of using ketoconazole cream, which was misplaced by his child but recently found. He reports a persistent rash that is worse than before and has not been consistently treated due to the misplaced medication.  He has a history of hand injuries, specifically mentioning a broken hand that causes pain, especially in the winter. He notes that this does not currently bother him significantly.   Visually reviewed and manually updated: Current Outpatient Medications on File Prior to Visit  Medication Sig   albuterol  (VENTOLIN HFA) 108 (90 Base) MCG/ACT inhaler Inhale 2 puffs into the lungs every 6 (six) hours as needed for wheezing or shortness of breath.   No current facility-administered medications on file prior to visit.   Medications Discontinued During This Encounter  Medication Reason   tirzepatide (ZEPBOUND) 2.5 MG/0.5ML Pen    ketoconazole (NIZORAL) 2 % cream Reorder      Objective      07/22/2023    2:59 PM 07/22/2023    2:45 PM 07/21/2022    3:13 PM  Vitals with BMI  Height  5\' 9"  5\' 9"   Weight  270 lbs 13 oz 244 lbs  BMI  39.97 36.02  Systolic 123 152 098  Diastolic 81 99 82  Pulse  87 83   Wt Readings from Last 10 Encounters:  07/22/23 270 lb 12.8 oz (122.8 kg)  07/21/22 244 lb (110.7 kg)  07/02/22 242 lb 3.2 oz (109.9 kg)  11/30/19 250 lb (113.4 kg)  12/10/15 250 lb (113.4 kg)  12/13/14 237 lb (107.5 kg)  11/03/12 222 lb (100.7 kg)   Vital signs reviewed.  Nursing notes reviewed. Weight trend reviewed. Physical Exam  Physical Exam   Abnormalities and Problem-Specific physical exam findings:  drowsy appearing  General Appearance:  No acute distress appreciable.   Well-groomed, healthy-appearing male.  Well proportioned with no abnormal fat distribution.  Good muscle tone. Pulmonary:  Normal work of breathing at rest, no respiratory distress apparent. SpO2: 95 %  Musculoskeletal: All extremities are intact.  Neurological:  Awake, alert, oriented, and engaged.  No obvious focal neurological deficits or cognitive  impairments.  Sensorium seems unclouded.   Speech is clear and coherent with logical content. Psychiatric:  Appropriate mood, pleasant and cooperative demeanor, thoughtful and engaged during the exam    No results found for any visits on 07/22/23. No visits with results within 1 Year(s) from this visit.  Latest known visit with results is:  Lab on 07/14/2022  Component Date Value   TSH 07/14/2022 1.51    Hgb A1c MFr Bld 07/14/2022 4.9    Cholesterol  07/14/2022 172    Triglycerides 07/14/2022 119.0    HDL 07/14/2022 49.30    VLDL 07/14/2022 23.8    LDL Cholesterol 07/14/2022 99    Total CHOL/HDL Ratio 07/14/2022 3    NonHDL 07/14/2022 123.06    HIV 1&2 Ab, 4th Generati* 07/14/2022 NON-REACTIVE    Hepatitis C Ab 07/14/2022 NON-REACTIVE    Sodium 07/14/2022 141    Potassium 07/14/2022 4.2    Chloride 07/14/2022 104    CO2 07/14/2022 28    Glucose, Bld 07/14/2022 104 (H)    BUN 07/14/2022 12    Creatinine, Ser 07/14/2022 0.96    Total Bilirubin 07/14/2022 0.6    Alkaline Phosphatase 07/14/2022 58    AST 07/14/2022 14    ALT 07/14/2022 16    Total Protein 07/14/2022 6.8    Albumin 07/14/2022 4.2    GFR 07/14/2022 100.05    Calcium 07/14/2022 9.6    WBC 07/14/2022 4.8    RBC 07/14/2022 5.14    Platelets 07/14/2022 243.0    Hemoglobin 07/14/2022 16.3    HCT 07/14/2022 46.0    MCV 07/14/2022 89.4    MCHC 07/14/2022 35.6    RDW 07/14/2022 13.8   No image results found. No results found.    Assessment & Plan Drowsiness He exhibits excessive daytime sleepiness, frequent naps, and disruptive snoring, indicating possible obstructive sleep apnea. This condition may lead to daytime fatigue and increased cardiovascular risk, with potential consequences such as dementia and early myocardial infarction. He is near the weight threshold for bariatric surgery, which could worsen sleep apnea. Discussed tirzepatide (Zepbound) for weight loss, as reducing neck weight can improve sleep apnea. Considered home versus in-lab sleep study, with the latter as the gold standard. Plan: Refer for in-lab sleep study to confirm diagnosis. Prescribe tirzepatide for weight loss. Discuss potential side effects, including constipation and nausea. Prescribe ondansetron (Zofran) for nausea and advise increased water intake for constipation. Recommend CPAP therapy if sleep apnea is confirmed. Suspected sleep apnea He exhibits excessive daytime sleepiness, frequent  naps, and disruptive snoring, indicating possible obstructive sleep apnea. This condition may lead to daytime fatigue and increased cardiovascular risk, with potential consequences such as dementia and early myocardial infarction. He is near the weight threshold for bariatric surgery, which could worsen sleep apnea. Discussed tirzepatide (Zepbound) for weight loss, as reducing neck weight can improve sleep apnea. Considered home versus in-lab sleep study, with the latter as the gold standard. Plan: Refer for in-lab sleep study to confirm diagnosis. Prescribe tirzepatide for weight loss. Discuss potential side effects, including constipation and nausea. Prescribe ondansetron (Zofran) for nausea and advise increased water intake for constipation. Recommend CPAP therapy if sleep apnea is confirmed. Class 2 severe obesity due to excess calories with serious comorbidity and body mass index (BMI) of 39.0 to 39.9 in adult [facility]) His weight has increased by 15-20 pounds since the last visit, nearing the bariatric surgery threshold. Obesity contributes to sleep apnea and fatigue. Discussed weight loss benefits, including improved sleep apnea and  overall health. Explained tirzepatide as a GLP-1 agonist that reduces appetite and promotes weight loss, potentially achieving a 20% reduction. Emphasized managing cortisol through dietary changes and timing of physical activity. Plan: Prescribe tirzepatide for weight loss. Advise dietary changes, including skipping breakfast to manage cortisol. Encourage afternoon or evening physical activity to optimize muscle gain and reduce cortisol impact. Nausea  Seborrheic dermatitis He has a persistent rash inadequately managed due to the loss of ketoconazole cream. This requires management, especially if CPAP therapy is initiated. Plan: Re-prescribe ketoconazole 2% cream and advise consistent use to manage the rash.  Follow-up   He requires follow-up to evaluate treatment efficacy  and conduct an annual exam with blood work. Emphasized fasting before blood work. Plan: Schedule follow-up in 28 days for annual exam and blood work. Ensure fasting before blood work.       Orders Placed During this Encounter:   Orders Placed This Encounter  Procedures   Ambulatory referral to Sleep Studies    Referral Priority:   Routine    Referral Type:   Consultation    Referral Reason:   Specialty Services Required    Number of Visits Requested:   1   Meds ordered this encounter  Medications   DISCONTD: tirzepatide (ZEPBOUND) 2.5 MG/0.5ML Pen    Sig: Inject 2.5 mg into the skin once a week.    Dispense:  2 mL    Refill:  11   ondansetron (ZOFRAN-ODT) 4 MG disintegrating tablet    Sig: Take 1 tablet (4 mg total) by mouth every 8 (eight) hours as needed for nausea or vomiting.    Dispense:  3 tablet    Refill:  2   tirzepatide (ZEPBOUND) 2.5 MG/0.5ML Pen    Sig: Inject 2.5 mg into the skin once a week. If constipation, take more water with fiber supplement; if diarrhea, eat less fat; if nausea, take zofran    Dispense:  2 mL    Refill:  1   ketoconazole (NIZORAL) 2 % cream    Sig: Apply 1 Application topically 2 (two) times daily.    Dispense:  60 g    Refill:  0       **This document was synthesized by artificial intelligence (Abridge) using HIPAA-compliant recording of the clinical interaction;   We discussed the use of AI scribe software for clinical note transcription with the patient, who gave verbal consent to proceed.    Additional Info: This encounter employed state-of-the-art, real-time, collaborative documentation. The patient actively reviewed and assisted in updating their electronic medical record on a shared screen, ensuring transparency and facilitating joint problem-solving for the problem list, overview, and plan. This approach promotes accurate, informed care. The treatment plan was discussed and reviewed in detail, including medication safety, potential  side effects, and all patient questions. We confirmed understanding and comfort with the plan. Follow-up instructions were established, including contacting the office for any concerns, returning if symptoms worsen, persist, or new symptoms develop, and precautions for potential emergency department visits.

## 2023-07-23 NOTE — Assessment & Plan Note (Signed)
 He has a persistent rash inadequately managed due to the loss of ketoconazole cream. This requires management, especially if CPAP therapy is initiated. Plan: Re-prescribe ketoconazole 2% cream and advise consistent use to manage the rash.

## 2023-07-23 NOTE — Assessment & Plan Note (Signed)
 His weight has increased by 15-20 pounds since the last visit, nearing the bariatric surgery threshold. Obesity contributes to sleep apnea and fatigue. Discussed weight loss benefits, including improved sleep apnea and overall health. Explained tirzepatide as a GLP-1 agonist that reduces appetite and promotes weight loss, potentially achieving a 20% reduction. Emphasized managing cortisol through dietary changes and timing of physical activity. Plan: Prescribe tirzepatide for weight loss. Advise dietary changes, including skipping breakfast to manage cortisol. Encourage afternoon or evening physical activity to optimize muscle gain and reduce cortisol impact.

## 2023-07-29 ENCOUNTER — Telehealth: Payer: Self-pay

## 2023-07-29 ENCOUNTER — Other Ambulatory Visit: Payer: Self-pay

## 2023-07-29 DIAGNOSIS — R110 Nausea: Secondary | ICD-10-CM

## 2023-07-29 DIAGNOSIS — R400 Somnolence: Secondary | ICD-10-CM

## 2023-07-29 DIAGNOSIS — E66812 Obesity, class 2: Secondary | ICD-10-CM

## 2023-07-29 DIAGNOSIS — R29818 Other symptoms and signs involving the nervous system: Secondary | ICD-10-CM

## 2023-07-29 MED ORDER — TIRZEPATIDE-WEIGHT MANAGEMENT 2.5 MG/0.5ML ~~LOC~~ SOAJ: 2.5000 mg | SUBCUTANEOUS | 1 refills | Status: DC

## 2023-07-29 MED ORDER — ONDANSETRON 4 MG PO TBDP: 4.0000 mg | ORAL_TABLET | Freq: Three times a day (TID) | ORAL | 2 refills | Status: AC | PRN

## 2023-07-29 NOTE — Telephone Encounter (Signed)
 Copied from CRM [id number]. Topic: Clinical - Prescription Issue >> Jul 28, 2023  3:39 PM Adele Barthel wrote: Reason for CRM:   Patient's wife is requesting patient's Zepbound and Zofran be transferred over to the [facility] on file. His previous pharmacy is having a difficult time getting these medications in stock.   CB#  [phone] >> Jul 29, 2023  8:15 AM Adele Barthel wrote: Patient's wife called again today to see if his Zepbound and Zofran had been able to be transferred to the [facility] pharmacy on file. Advised to give more time for clinic to send medications.       I have sent the Zofran and Zepbound to the requested pharmacy. Donzetta Starch, CMA

## 2023-08-07 ENCOUNTER — Telehealth: Payer: Self-pay

## 2023-08-07 ENCOUNTER — Other Ambulatory Visit (HOSPITAL_COMMUNITY): Payer: Self-pay

## 2023-08-07 NOTE — Telephone Encounter (Signed)
 Pharmacy Patient Advocate Encounter   Received notification from CoverMyMeds that prior authorization for Zepbound 2.5MG /0.5ML pen-injectors is required/requested.   Insurance verification completed.   The patient is insured through [facility] .   Per test claim: PA required; PA submitted to above mentioned insurance via CoverMyMeds Key/confirmation #/EOC [id number] Status is pending

## 2023-08-10 ENCOUNTER — Other Ambulatory Visit (HOSPITAL_COMMUNITY): Payer: Self-pay

## 2023-08-10 NOTE — Telephone Encounter (Signed)
 Pharmacy Patient Advocate Encounter  Received notification from [facility] that Prior Authorization for Zepbound 2.5MG /0.5ML pen-injectors  has been APPROVED from 08/07/23 to 02/07/24. Ran test claim, Copay is $24.99. This test claim was processed through [facility]- copay amounts may vary at other pharmacies due to pharmacy/plan contracts, or as the patient moves through the different stages of their insurance plan.   PA #/Case ID/Reference #: [id number]

## 2023-08-11 NOTE — Telephone Encounter (Signed)
 Spoke with pt stated he pick medication yesterday.

## 2023-08-18 ENCOUNTER — Ambulatory Visit: Payer: PRIVATE HEALTH INSURANCE | Admitting: Internal Medicine

## 2023-09-08 ENCOUNTER — Encounter: Payer: Self-pay | Admitting: Internal Medicine

## 2023-09-08 ENCOUNTER — Ambulatory Visit (INDEPENDENT_AMBULATORY_CARE_PROVIDER_SITE_OTHER): Payer: PRIVATE HEALTH INSURANCE | Admitting: Internal Medicine

## 2023-09-08 VITALS — BP 128/74 | HR 74 | Temp 98.1°F | Wt 252.4 lb

## 2023-09-08 DIAGNOSIS — E66812 Obesity, class 2: Secondary | ICD-10-CM | POA: Diagnosis not present

## 2023-09-08 DIAGNOSIS — Z6839 Body mass index (BMI) 39.0-39.9, adult: Secondary | ICD-10-CM

## 2023-09-08 DIAGNOSIS — L700 Acne vulgaris: Secondary | ICD-10-CM | POA: Diagnosis not present

## 2023-09-08 DIAGNOSIS — M5136 Other intervertebral disc degeneration, lumbar region with discogenic back pain only: Secondary | ICD-10-CM

## 2023-09-08 DIAGNOSIS — R400 Somnolence: Secondary | ICD-10-CM

## 2023-09-08 DIAGNOSIS — R29818 Other symptoms and signs involving the nervous system: Secondary | ICD-10-CM | POA: Diagnosis not present

## 2023-09-08 MED ORDER — BENZOYL PEROXIDE 10 % EX LIQD: 1.0000 | Freq: Every day | CUTANEOUS | 11 refills | Status: AC

## 2023-09-08 MED ORDER — ZEPBOUND 5 MG/0.5ML ~~LOC~~ SOAJ: 5.0000 mg | SUBCUTANEOUS | 11 refills | Status: AC

## 2023-09-08 MED ORDER — ADAPALENE 0.1 % EX CREA
TOPICAL_CREAM | Freq: Every day | CUTANEOUS | 3 refills | Status: AC
Start: 2023-09-08 — End: ?

## 2023-09-08 MED ORDER — MINOCYCLINE HCL 100 MG PO CAPS: 100.0000 mg | ORAL_CAPSULE | Freq: Every day | ORAL | 0 refills | Status: AC

## 2023-09-08 NOTE — Progress Notes (Signed)
 ==============================  [facility]: [phone]   -- Medical Office Visit --  Patient: Phillip Shaw      Age: 37 y.o.       Sex:  male  Date:   09/08/2023 Today's Healthcare Provider: Anthon Kins, MD  ==============================   Chief Complaint: Follow-up and Back Pain back pain main issue also we are follow up zepbound  History of Present Illness 38 year old male who presents with back pain and persistent back acne.  He has experienced back pain for approximately five years, characterized by spasms when bent over for extended periods, such as using a weed eater or picking up objects. The pain is relieved temporarily by standing up and is not constant, being related to physical activities involving repeated bending. He manages the pain by taking breaks during activities and suspects it might be due to a 'worn out disc'.  He has persistent back acne since shortly after puberty, described as severe with large lesions that appear suddenly and are difficult to manage despite regular washing. A previous treatment with doxycycline provided temporary improvement. There is a family history of similar acne, as his cousin experienced severe back acne as well.  He is taking Zepbound  2.5 mg for weight management and has lost nearly 20 pounds in a month. He engages in regular physical activity, including walking on a golf course, and has noticed a decrease in appetite since starting the medication.  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 He works for himself and manages his back pain by taking breaks and using proper lifting techniques.  No history of cancer or lumps and bumps suggestive of cancer in the spine. No skin spots but has severe back acne. No significant changes in back pain suggesting an acute issue.  Background Reviewed: Problem List: has Obesity due to  excess calories; History of smoking 10-25 pack years; Hearing loss; Seborrheic dermatitis; Snoring; Otosclerosis; and Chronic sinusitis on their problem list. Medications:  has a current medication list which includes the following prescription(s): adapalene, albuterol , benzoyl peroxide, ketoconazole , minocycline, ondansetron , and zepbound .  Allergies:  has no known allergies.  Past Medical History:  has a past medical history of Arthritis (07/02/2022) and Hearing loss (07/02/2022). Past Surgical History:   has a past surgical history that includes Wisdom tooth extraction. Social History:   reports that he quit smoking about 8 years ago. His smoking use included cigarettes. He started smoking about 22 years ago. He has a 14 pack-year smoking history. He has never used smokeless tobacco. He reports that he does not currently use alcohol after a past usage of about 12.0 standard drinks of alcohol per week. He reports that he does not currently use drugs after having used the following drugs: Marijuana. Family History:  family history includes Arthritis in his maternal grandmother; Cancer in his maternal grandfather, maternal grandmother, mother, sister, and another family member; Depression in his mother; Diabetes in his mother and another family member; Drug abuse in his mother; Early death in his mother and sister. Depression Screen and Health Maintenance:    09/08/2023    8:53 AM 07/22/2023    2:57 PM 07/21/2022    3:17 PM 07/02/2022    3:50 PM  PHQ 2/9 Scores  PHQ - 2 Score 0 0 0 0  PHQ- 9 Score   6     Medication Reconciliation: Current Outpatient Medications on  File Prior to Visit  Medication Sig   albuterol  (VENTOLIN  HFA) 108 (90 Base) MCG/ACT inhaler Inhale 2 puffs into the lungs every 6 (six) hours as needed for wheezing or shortness of breath.   ketoconazole  (NIZORAL ) 2 % cream Apply 1 Application topically 2 (two) times daily.   ondansetron  (ZOFRAN -ODT) 4 MG disintegrating tablet Take 1  tablet (4 mg total) by mouth every 8 (eight) hours as needed for nausea or vomiting.   No current facility-administered medications on file prior to visit.   Medications Discontinued During This Encounter  Medication Reason   tirzepatide  (ZEPBOUND ) 2.5 MG/0.5ML Pen      Physical Exam:    09/08/2023    8:53 AM 07/22/2023    2:59 PM 07/22/2023    2:45 PM  Vitals with BMI  Height   5\' 9"   Weight 252 lbs 6 oz  270 lbs 13 oz  BMI   39.97  Systolic 128 123 829  Diastolic 74 81 99  Pulse 74  87  Vital signs reviewed.  Nursing notes reviewed. Weight trend reviewed. Physical Exam General Appearance:  No acute distress appreciable.   Well-groomed, healthy-appearing male.  Well proportioned with no abnormal fat distribution.  Good muscle tone. Pulmonary:  Normal work of breathing at rest, no respiratory distress apparent. SpO2: 96 %  Musculoskeletal: All extremities are intact.  Neurological:  Awake, alert, oriented, and engaged.  No obvious focal neurological deficits or cognitive impairments.  Sensorium seems unclouded.   Speech is clear and coherent with logical content. Psychiatric:  Appropriate mood, pleasant and cooperative demeanor, thoughtful and engaged during the exam Physical Exam SKIN: Severe acne on the back From back, pain is around T12 with flexion loaded main exacerbater..      No results found for any visits on 09/08/23. No visits with results within 1 Year(s) from this visit.  Latest known visit with results is:  Lab on 07/14/2022  Component Date Value   TSH 07/14/2022 1.51    Hgb A1c MFr Bld 07/14/2022 4.9    Cholesterol 07/14/2022 172    Triglycerides 07/14/2022 119.0    HDL 07/14/2022 49.30    VLDL 07/14/2022 23.8    LDL Cholesterol 07/14/2022 99    Total CHOL/HDL Ratio 07/14/2022 3    NonHDL 07/14/2022 123.06    HIV 1&2 Ab, 4th Generati* 07/14/2022 NON-REACTIVE    Hepatitis C Ab 07/14/2022 NON-REACTIVE    Sodium 07/14/2022 141    Potassium 07/14/2022 4.2     Chloride 07/14/2022 104    CO2 07/14/2022 28    Glucose, Bld 07/14/2022 104 (H)    BUN 07/14/2022 12    Creatinine, Ser 07/14/2022 0.96    Total Bilirubin 07/14/2022 0.6    Alkaline Phosphatase 07/14/2022 58    AST 07/14/2022 14    ALT 07/14/2022 16    Total Protein 07/14/2022 6.8    Albumin 07/14/2022 4.2    GFR 07/14/2022 100.05    Calcium 07/14/2022 9.6    WBC 07/14/2022 4.8    RBC 07/14/2022 5.14    Platelets 07/14/2022 243.0    Hemoglobin 07/14/2022 16.3    HCT 07/14/2022 46.0    MCV 07/14/2022 89.4    MCHC 07/14/2022 35.6    RDW 07/14/2022 13.8   No image results found. No results found.    Results RADIOLOGY Foot X-ray: Arthritis (06/2022)    Assessment & Plan Degeneration of intervertebral disc of lumbar region with discogenic back pain Chronic back pain is likely due to degenerative disc  disease, worsened by manual labor and poor ergonomics. Differential diagnosis includes muscle spasm and potential infection secondary to severe back acne, though spinal infection is unlikely. Pain occurs with bending and lifting, relieved by standing upright. Imaging and surgery are not immediately necessary, and he declined imaging due to cost. Provide ergonomic advice and recommend tool modifications to reduce strain. Suggest purchasing adjustable length tools and back-saving equipment. Recommend wearing a back brace during work for support. Provide a handout on ergonomic tips and tool modifications. Prescribe minocycline for 3 months to address potential infection and severe back acne.  Given this patient's history and physical exam, the most likely cause for his back pain is degenerative osteoarthritis and degenerative disk disease of his vertebral column.  I have considered and concluded the patient has a very low likelihood of having one of the following serious causes of back pain: bone tumor (no tumors/masses), acute bone fracture (no severe trauma or bone loss), aortic aneurysm  (no high blood pressure, long term smoking, ripping/tearing sensations), vertebral disk infection (fevers,infections, IVDU), or pyelonephritis (no pain radiating to groin, costovertebral angle tenderness).   He also has no evidence for acute spinal compression (no weakness/numbness, loss of bowel or bladder function).  He was advised to go to ER if symptom(s) suggestive of these develop.  These red flag alarm symptom(s) are copied here to his chart for reference.   Standard treatment offered & documented here: For acute pain, rest, intermittent application of cold packs (later, may switch to heat, but do not sleep on heating pad), analgesics and muscle relaxants are recommended.  For more chronic pain, take as needed NSAID's (with plenty of fluids)  Proper lifting with avoidance of heavy lifting discussed.  Provided instructions in AVS for how to do stretches and discussed using it for a home back care exercise program with flexion exercise routine.  Physical Therapy and Xray offered and/or ordered. he will call or return to clinic prn if these symptoms worsen or fail to improve as anticipated.    Comedonal acne Severe back acne has been present since adolescence and may contribute to back pain if infection spreads. Previous doxycycline treatment was effective. Acne is extensive, persistent, and possibly familial. Emphasize the importance of treating acne to prevent complications. He is concerned about the persistence and severity despite hygiene efforts. Prescribe minocycline for 3 months. Provide a detailed handout on acne management, including topical treatments and application techniques. Refer to dermatology for further evaluation, though the appointment may take 6 months. Discuss using a back scratcher for applying topical treatments. Suspected sleep apnea Zepbound  likely helping, unconfirmed. Class 2 severe obesity due to excess calories with serious comorbidity and body mass index (BMI) of 39.0  to 39.9 in adult [facility]) Zepbound  helping, I tried to increase dose and advised patient would give plenty of 5 mg and he can follow up if he needs me to make dose adjust.  General Health Maintenance   He has lost 20 pounds since the last visit due to lifestyle changes and medication. Currently on Zepbound  2.5 mg for weight management, with plans to increase to 5 mg after the next refill. He engages in regular physical activity, including walking and golfing, and prefers to monitor appetite and weight loss before increasing the dose. Continue Zepbound  2.5 mg and plan to increase to 5 mg after the next refill. Encourage continued weight loss and physical activity. Provide refills for Zepbound  as needed.  Follow-up   He has multiple ongoing health issues requiring  monitoring and potential medication adjustments. Discussed follow-up in 3 to 6 months based on his preference and financial considerations. Schedule a follow-up appointment in 3 to 6 months, based on his preference. Advise him to contact the office if any issues arise before the scheduled follow-up.       Orders Placed During this Encounter:   Orders Placed This Encounter  Procedures   Ambulatory referral to Dermatology    Referral Priority:   Routine    Referral Type:   Consultation    Referral Reason:   Specialty Services Required    Requested Specialty:   Dermatology    Number of Visits Requested:   1   Meds ordered this encounter  Medications   adapalene (DIFFERIN) 0.1 % cream    Sig: Apply topically at bedtime.    Dispense:  45 g    Refill:  3   benzoyl peroxide 10 % LIQD    Sig: Apply 1 tablet topically daily at 6 (six) AM.    Dispense:  227 g    Refill:  11   minocycline (MINOCIN) 100 MG capsule    Sig: Take 1 capsule (100 mg total) by mouth daily.    Dispense:  90 capsule    Refill:  0   tirzepatide  (ZEPBOUND ) 5 MG/0.5ML Pen    Sig: Inject 5 mg into the skin once a week. Start after 1 more month on 2.5 mg weekly.     Dispense:  2 mL    Refill:  11   ED Discharge Orders          Ordered    Ambulatory referral to Dermatology        09/08/23 0943    adapalene (DIFFERIN) 0.1 % cream  Daily at bedtime        09/08/23 0943    benzoyl peroxide 10 % LIQD  Daily        09/08/23 0943    minocycline (MINOCIN) 100 MG capsule  Daily        09/08/23 0943    tirzepatide  (ZEPBOUND ) 5 MG/0.5ML Pen  Weekly        09/08/23 0943              This document was synthesized by artificial intelligence ([employer]bridge) using HIPAA-compliant recording of the clinical interaction;   We discussed the use of AI scribe software for clinical note transcription with the patient, who gave verbal consent to proceed. additional Info: This encounter employed state-of-the-art, real-time, collaborative documentation. The patient actively reviewed and assisted in updating their electronic medical record on a shared screen, ensuring transparency and facilitating joint problem-solving for the problem list, overview, and plan. This approach promotes accurate, informed care. The treatment plan was discussed and reviewed in detail, including medication safety, potential side effects, and all patient questions. We confirmed understanding and comfort with the plan. Follow-up instructions were established, including contacting the office for any concerns, returning if symptoms worsen, persist, or new symptoms develop, and precautions for potential emergency department visits.

## 2023-09-08 NOTE — Assessment & Plan Note (Signed)
 Zepbound  helping, I tried to increase dose and advised patient would give plenty of 5 mg and he can follow up if he needs me to make dose adjust.

## 2023-09-08 NOTE — Patient Instructions (Addendum)
 VISIT SUMMARY:  Today, we discussed your chronic back pain and severe back acne. We reviewed your current symptoms, lifestyle, and treatment options. We also talked about your weight management progress and general health maintenance.  YOUR PLAN:  -BACK PAIN: Your chronic back pain is likely due to degenerative disc disease, which means the discs in your spine are wearing out. 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 We recommend using adjustable length tools and back-saving equipment, wearing a back brace during work, and following ergonomic tips to reduce strain. We also prescribed minocycline for 3 months to address any potential infection from your back acne.  -SEVERE BACK ACNE: Your severe back acne has been persistent since adolescence and may be contributing to your back pain if an infection spreads. We prescribed minocycline for 3 months and provided a handout on acne management, including topical treatments and application techniques. We also referred you to a dermatologist for further evaluation, though the appointment may take up to 6 months. Using a back scratcher can help you apply topical treatments more effectively.  -GENERAL HEALTH MAINTENANCE: You have lost 20 pounds since your last visit due to lifestyle changes and medication. You are currently taking Zepbound  2.5 mg for weight management and plan to increase to 5 mg after your next refill. Continue your regular physical activity, including walking and golfing, and monitor your appetite and weight loss before increasing the dose. We will provide refills for Zepbound  as needed. Keep up the good work with your weight loss and physical activity.  INSTRUCTIONS:  Please schedule a follow-up appointment in 3 to 6 months based on your preference. Contact our office if any issues arise before your scheduled follow-up.    ?? BACK ACNE TREATMENT INSTRUCTIONS Your Treatment Plan: You are starting a 3-part regimen to  help clear your back acne. This includes: Differin (adapalene 0.1%) gel - for unclogging pores Benzoyl peroxide 10% wash - for killing acne-causing bacteria Minocycline 100 mg capsule - an oral antibiotic for inflammation  ?? Morning Routine: Shower as usual. Use benzoyl peroxide 10% wash: Wet your back, apply a thin layer of wash, and leave it on for 2-5 minutes before rinsing off. Avoid contact with colored towels or clothing (may bleach fabric). Pat dry with a white towel. Wear a clean cotton shirt daily. Take minocycline 100 mg orally: Take with a full glass of water (stay upright for 30 minutes). Can be taken with or without food (if stomach upset occurs, take with a small non-dairy snack). Avoid taking with calcium, magnesium, iron, antacids, or multivitamins (space by 2 hours).  ?? Night Routine: Optional: Lightly cleanse or shower if sweating occurred during the day. Make sure the back is fully dry. Apply a thin layer of Differin gel to the affected areas: Use a pea-sized amount per shoulder/back region. Rub in gently. Wash hands afterward. Avoid layering benzoyl peroxide and Differin together at night to reduce irritation.  ? What to Expect: Mild irritation or dryness is common for the first 2-4 weeks. Acne may temporarily worsen before improving - this is normal. Improvement usually begins at 4-6 weeks, with more noticeable results by 12 weeks. Continue full 37-month course unless directed otherwise.  ?? Important Tips: Avoid sunburn on treated areas - use sun-protective clothing. Do not pick or scrub acne lesions. If severe dryness, redness, or peeling occurs, skip Differin for 1-2 nights. If rash, dizziness, or persistent headache occurs with minocycline - stop and call your doctor.  ??  TOOL MODIFICATIONS & WORK ADAPTATIONS FOR BACK PAIN ? General Strategies: Minimize forward flexion (bending at the waist). Keep loads close to the body. Limit repetitive  twisting. Use neutral spine posture and hip hinge techniques. Take frequent micro-breaks to stretch and reset posture.  ??? Tool Modifications to Reduce Back Strain: 1. Extendable Handles for All Tools Use long-handled or adjustable-length tools to avoid bending. Brands: [employer], [employer], and [employer] offer ergonomic gardening tools. Ideal for: rakes, hoes, shovels, weeders, trimmers. 2. Backpack or Harness-Mounted [employer] Upgrade to a backpack-style or shoulder-harness trimmer: Shifts the weight from arms/lumbar spine to shoulders and hips. Brands: [employer], [employer], or [employer] with harness attachment. Balance the shaft angle to avoid hunching forward. 3. Ergonomic Shovels Use a bent-handle or "back-saving" shovel: Keeps the spine more upright during digging. Look for models with D-grip handles, wide padded grips, and offset angles. 4. Wheelbarrows or Garden [employer] Use two-wheeled carts instead of lifting loads: Helps reduce strain from carrying mulch, soil, or tools. Keeps center of gravity low and balanced. 5. Axe Alternatives or Modifications Use a lighter axe or maul with shock-absorbing handle. Consider splitting wedges with a sledgehammer to reduce swinging overhead. Maintain upright posture by splitting wood on a raised stump or chopping block.  ???? Posture & Body Mechanics Tips: Use hip hinging (bend at hips, not waist) when reaching or lifting. Keep elbows close to your sides. Engage core muscles with each motion. Try lumbosacral support belts for longer tasks (not for all-day use, but good during flares).  ?? Optional Aids to Consider: Kneeling benches with handles (for ground-level tasks). Rolling garden stools for low-to-mid height work. Anti-fatigue mats or padded insoles if standing long hours.

## 2024-01-19 ENCOUNTER — Ambulatory Visit: Payer: PRIVATE HEALTH INSURANCE | Admitting: Internal Medicine

## 2024-01-21 ENCOUNTER — Ambulatory Visit: Payer: PRIVATE HEALTH INSURANCE | Admitting: Internal Medicine

## 2024-01-21 ENCOUNTER — Encounter: Payer: Self-pay | Admitting: Internal Medicine

## 2024-01-21 VITALS — BP 138/80 | HR 76 | Temp 97.8°F | Ht 69.0 in | Wt 228.0 lb

## 2024-01-21 DIAGNOSIS — M5136 Other intervertebral disc degeneration, lumbar region with discogenic back pain only: Secondary | ICD-10-CM | POA: Diagnosis not present

## 2024-01-21 DIAGNOSIS — R0683 Snoring: Secondary | ICD-10-CM

## 2024-01-21 DIAGNOSIS — Z720 Tobacco use: Secondary | ICD-10-CM

## 2024-01-21 DIAGNOSIS — E66811 Obesity, class 1: Secondary | ICD-10-CM

## 2024-01-21 DIAGNOSIS — J324 Chronic pansinusitis: Secondary | ICD-10-CM

## 2024-01-21 DIAGNOSIS — Z0001 Encounter for general adult medical examination with abnormal findings: Secondary | ICD-10-CM | POA: Diagnosis not present

## 2024-01-21 DIAGNOSIS — L700 Acne vulgaris: Secondary | ICD-10-CM

## 2024-01-21 DIAGNOSIS — E6609 Other obesity due to excess calories: Secondary | ICD-10-CM

## 2024-01-21 MED ORDER — FLUTICASONE PROPIONATE 50 MCG/ACT NA SUSP: 2.0000 | Freq: Every day | NASAL | 6 refills | Status: AC

## 2024-01-21 MED ORDER — DOXYCYCLINE HYCLATE 100 MG PO TABS: 100.0000 mg | ORAL_TABLET | Freq: Two times a day (BID) | ORAL | 0 refills | Status: AC

## 2024-01-21 MED ORDER — PSEUDOEPHEDRINE HCL ER 120 MG PO TB12
120.0000 mg | ORAL_TABLET | Freq: Two times a day (BID) | ORAL | 0 refills | Status: AC
Start: 2024-01-21 — End: ?

## 2024-01-21 MED ORDER — SIMPLY SALINE 0.9 % NA AERS
2.0000 | INHALATION_SPRAY | NASAL | 11 refills | Status: AC
Start: 2024-01-21 — End: ?

## 2024-01-21 MED ORDER — LORATADINE 10 MG PO TABS: 10.0000 mg | ORAL_TABLET | Freq: Every day | ORAL | 11 refills | Status: AC

## 2024-01-21 NOTE — Patient Instructions (Addendum)
 VISIT SUMMARY: Today, you had a follow-up appointment to discuss your hearing, smoking, weight, seborrheic dermatitis, and snoring. We reviewed your progress and made adjustments to your treatment plan to help manage your conditions more effectively.  YOUR PLAN: -CHRONIC PANSINUSITIS WITH ALLERGIC RHINITIS: Chronic pansinusitis with allergic rhinitis is a long-term inflammation of the sinuses and nasal passages, often worsened by allergies. To manage your symptoms, you are prescribed doxycycline  for a possible bacterial infection, and you should use saline rinses, Flonase  sprays, Claritin , and Sudafed. A sinus care handout was provided.  -DEGENERATION OF INTERVERTEBRAL DISC OF LUMBAR REGION WITH DISCOGENIC BACK PAIN: This condition involves the wearing down of the discs in your lower back, causing pain. Your weight loss has helped, but you should avoid heavy manual labor to prevent further damage. TRX suspension training is recommended as it avoids spinal compression. Continue focusing on weight loss to help alleviate back pain.  -CLASS 2 SEVERE OBESITY DUE TO EXCESS CALORIES: Class 2 obesity means having a BMI between 39.0 and 39.9. You've made progress with weight loss, but further reduction is needed. Emphasize exercise, dietary management, and maintaining muscle mass. TRX suspension training is recommended for exercise.  -SUSPECTED SLEEP APNEA: Sleep apnea is a condition where breathing repeatedly stops and starts during sleep. Your symptoms have improved with weight loss, so a sleep study is not immediately necessary. Continue managing your weight and allergies, and monitor your symptoms. Consider a sleep study if symptoms worsen.  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 You are currently using oral antibiotics. Be aware of potential photosensitivity with certain treatments, and consider additional topical treatments if needed.  -NICOTINE USE, ORAL  POUCHES: You use oral nicotine pouches as an alternative to smoking. We discussed tapering off to save money and reduce potential dental health risks. Monitor for nicotine nausea and other side effects, and consider dental health check-ups.  -ADULT WELLNESS VISIT: An annual physical exam is important for monitoring your health and maintaining insurance benefits. We conducted your annual physical today and discussed its benefits.  INSTRUCTIONS: Please follow the prescribed treatments and lifestyle recommendations. Schedule a follow-up appointment if your symptoms worsen or if you have any concerns. Continue with your annual physical exams for ongoing health monitoring.  Building Your Long-Term Health Plan  During today's preventive visit, we covered a variety of important health checks to help you stay on top of your well-being.  We also discussed strategies to maintain your health and identified some areas that might benefit from further exploration.   Preventive care visits like today's are designed to be proactive, but sometimes additional attention may be needed.  Rest assured, we're here for you.  If these areas require further evaluation or management, we'd be happy to schedule a separate, focused appointment to address them in detail.  Addressing Next Steps  [x]   Follow-up Visit: To ensure we address any unresolved issues and continue monitoring your overall health, we recommend scheduling a follow-up appointment in 1 year for your next preventive care visit. If you experience any new problems, need to discuss any medical concerns, or your condition worsens before then, please don't hesitate to call our office to schedule an appointment or seek emergency care as needed.  [x]   Preventive Measures: Maintaining healthy habits plays a crucial role in overall wellness. We recommend considering these tips: [x]   Regular appointments with dental and vision professionals [x]   Nightly nasal saline mist  to keep sinuses clear [x]   Consistent toothbrushing to maintain oral health [x]   Using an app like SnoreLab to track sleep quality [x]   Routine checks of blood pressure and heart rate [x]   Medical Information: In some instances, we may require additional medical information from other providers to create a comprehensive picture of your health. If applicable, we can provide a medical information release form at the front desk for you to sign, allowing us  to gather these records. [x]   Lab Tests: If any lab tests were ordered today, scheduling them within a week of your visit helps ensure the best possible insurance coverage.  Planning Follow Up to Work on a Problem? Make the Most of Our Focused (20 minute) Appointments  [x]   Clearly state your top concerns at the beginning of the visit to focus our discussion [x]   If you anticipate you will need more time, please inform the front desk during scheduling - we can book multiple appointments in the same week. [x]   If you have transportation problems- use our convenient video appointments or ask about transportation support. [x]   We can get down to business faster if you use MyChart to update information before the visit and submit non-urgent questions before your visit. Thank you for taking the time to provide details through MyChart.  Let our nurse know and she can import this information into your encounter documents.  Arrival and Wait Times  [x]   Arriving on time ensures that everyone receives prompt attention. [x]   Early morning (8a) and afternoon (1p) appointments tend to have shortest wait times. [x]   Unfortunately, we cannot delay appointments for late arrivals or hold slots during phone calls.  Bring to Your Next Appointment:  [x]   Medications: Please bring all your medication bottles to your next appointment to ensure we have an accurate record of your prescriptions. [x]   Health Diaries: If you're monitoring any health conditions at home,  keeping a diary of your readings can be very helpful for discussions at your next appointment.  Reviewing Your Records  [x]   Review your attached preventive care information at the end of these patient instructions. [x]   Review this early draft of your clinical encounter notes below and the final encounter summary tomorrow on MyChart after its been completed.      Getting Answers and Following Up  [x]   Simple Questions & Concerns: For quick questions or basic follow-up after your visit, reach us  at [phone] or MyChart messaging. [x]   Complex Concerns: If your concern is more complex, scheduling an appointment might be best. Discuss this with the staff to find the most suitable option. [x]   Lab & Imaging Results: We'll contact you directly if results are abnormal or you don't use MyChart. Most normal results will be on MyChart within 2-3 business days, with a review message from Dr. Jesus. Haven't heard back in 2 weeks? Need results sooner? Contact us  at [phone]. [x]   Referrals: Our referral coordinator will manage specialist referrals. The specialist's office should contact you within 2 weeks to schedule an appointment. Call us  if you haven't heard from them after 2 weeks.  Staying Connected  [x]   MyChart: Activate your MyChart for the fastest way to access results and message us . See the last page of this paperwork for instructions on how to activate.  Billing  [x]   X-ray & Lab Orders: These are billed by separate companies. Contact the invoicing company directly for questions or concerns. [x]   Visit Charges: Discuss any billing inquiries with our administrative services team.  Your Satisfaction Matters  [x]   Share Your Experience: We strive for your satisfaction! If you have any complaints, or preferably compliments, please let Dr. Jesus know directly or contact our Practice Administrators, Manuelita Rubin or [employer], by asking at the front desk.                  Next Steps  [x]   Schedule Follow-Up:  We recommend a follow-up appointment in 1 year for your next wellness visit.  If you develop any new problems, want to address any medical issues, or your condition worsens before then, please call us  for an appointment or seek emergency care. [x]   Preventive Care:  Make sure to keep regular appointments with dental and vision professionals, use nightly nasal saline mist sprays to keep your sinuses clear and toothbrushing to protect your teeth. Use SnoreLab App or other app to track your sleep quality. Check blood pressure and heart rate routinely. [x]   Medical Information Release:  For any relevant medical information we don't have, please sign a release form at the front desk so we can obtain it for your records. [x]   Lab Tests:  Schedule any lab tests from today for within a week to ensure best insurance coverage.    Making the Most of Our Focused (20 minute) Appointments:  [x]   Clearly state your top concerns at the beginning of the visit to focus our discussion [x]   If you anticipate you will need more time, please inform the front desk during scheduling - we can book multiple appointments in the same week. [x]   If you have transportation problems- use our convenient video appointments or ask about transportation support. [x]   We can get down to business faster if you use MyChart to update information before the visit and submit non-urgent questions before your visit. Thank you for taking the time to provide details through MyChart.  Let our nurse know and she can import this information into your encounter documents.  Arrival and Wait Times: [x]   Arriving on time ensures that everyone receives prompt attention. [x]   Early morning (8a) and afternoon (1p) appointments tend to have shortest wait times. [x]   Unfortunately, we cannot delay appointments for late arrivals or hold slots during phone calls.  Bring to Your Next  Appointment  [x]   Medications: Please bring all your medication bottles to your next appointment to ensure we have an accurate record of your prescriptions. [x]   Health Diaries: If you're monitoring any health conditions at home, keeping a diary of your readings can be very helpful for discussions at your next appointment.  Reviewing Your Records  [x]   Review your attached preventive care information at the end of these patient instructions. [x]   Review this early draft of your clinical encounter notes below and the final encounter summary tomorrow on MyChart after its been completed.   Encounter for annual general medical examination with abnormal findings in adult  Chronic pansinusitis -     Fluticasone  Propionate; Place 2 sprays into both nostrils daily.  Dispense: 16 g; Refill: 6 -     Simply Saline; Place 2 each into the nose as directed. Use nightly for sinus hygiene long-term.  Can also be used as many times daily as desired to assist with clearing congested sinuses.  Dispense: 127 mL; Refill: 11 -     Loratadine ; Take 1 tablet (10 mg total) by mouth daily.  Dispense: 30 tablet; Refill: 11 -     Pseudoephedrine  HCl ER; Take 1  tablet (120 mg total) by mouth 2 (two) times daily.  Dispense: 20 tablet; Refill: 0 -     Doxycycline  Hyclate; Take 1 tablet (100 mg total) by mouth 2 (two) times daily.  Dispense: 20 tablet; Refill: 0  Degeneration of intervertebral disc of lumbar region with discogenic back pain  Class 1 obesity due to excess calories without serious comorbidity in adult, unspecified BMI  Snoring  Comedonal acne  Nicotine use     Getting Answers and Following Up  [x]   Simple Questions & Concerns: For quick questions or basic follow-up after your visit, reach us  at [phone] or MyChart messaging. [x]   Complex Concerns: If your concern is more complex, scheduling an appointment might be best. Discuss this with the staff to find the most suitable option. [x]   Lab &  Imaging Results: We'll contact you directly if results are abnormal or you don't use MyChart. Most normal results will be on MyChart within 2-3 business days, with a review message from Dr. Jesus. Haven't heard back in 2 weeks? Need results sooner? Contact us  at [phone]. [x]   Referrals: Our referral coordinator will manage specialist referrals. The specialist's office should contact you within 2 weeks to schedule an appointment. Call us  if you haven't heard from them after 2 weeks.  Staying Connected  [x]   MyChart: Activate your MyChart for the fastest way to access results and message us . See the last page of this paperwork for instructions on how to activate.  Billing  [x]   X-ray & Lab Orders: These are billed by separate companies. Contact the invoicing company directly for questions or concerns. [x]   Visit Charges: Discuss any billing inquiries with our administrative services team.  Your Satisfaction Matters  [x]   Share Your Experience: We strive for your satisfaction! If you have any complaints, or preferably compliments, please let Dr. Jesus know directly or contact our Practice Administrators, Manuelita Rubin or [employer], by asking at the front desk.    Medical Screening Exam A medical screening exam (MSE) helps to determine whether you need immediate medical treatment relating to any number of symptoms you are having. This type of exam may be done in an emergency department, an urgent care setting, or your health care provider's office. Depending on your symptoms and severity, you may need additional tests or medical therapy. It is important to note that an MSE does not necessarily mean that you will need or receive further medical testing or interventions if your symptoms are not deemed to be medically urgent (emergent). Tell a health care provider about: Any allergies you have. All medicines you are taking, including vitamins, herbs, eye drops, creams, and  over-the-counter medicines. Any problems you or family members have had with anesthetic medicines. Any bleeding problems you have. Any surgeries you have had. Any medical conditions you have. Whether you are pregnant or may be pregnant. What happens during the test? During the exam, a health care provider does a short, often focused, physical exam and asks about your medical history to assess: Your current symptoms. Your overall health. Your need for possible further medical intervention. What can I expect after the test? If you have a regular health care provider, make an appointment for a follow-up visit with him or her. If you do not have a regular health care provider, ask about resources in your community. Your medical screening exam may determine that: You do not need emergency treatment at this time. You need treatment right away. You need to  be transferred to another medical center. This may happen if you need an emergent specialist or consultant that is not available at the medical center you are at. You need to have more tests. A medical specialist may be consulted if needed. Get help right away if: Your condition gets worse. You develop new or troubling symptoms before you see your health care provider. These symptoms may represent a serious problem that is an emergency. Do not wait to see if the symptoms will go away. Get medical help right away. Call your local emergency services (911 in the [place] Do not drive yourself to the hospital. Summary A medical screening exam helps to determine whether you need medical treatment right away. This type of exam may be done in an emergency department, an urgent care setting, or your health care provider's office. During the exam, a health care provider does a short physical exam and asks about your current symptoms and overall health. Depending on the exam, more tests or therapies may be ordered. However, an MSE does not necessarily mean  that you will have further medical testing if your symptoms are not deemed to be urgent. If you need further care that is not offered at your current medical center, you may need to be transferred to another facility. This information is not intended to replace advice given to you by your health care provider. Make sure you discuss any questions you have with your health care provider. Document Revised: 01/09/2021 Document Reviewed: 09/06/2020 Else[employer] Patient Education  2024 [employer]       ALLERGY MANAGEMENT PLAN  This plan is designed to help manage your allergic rhinitis (nasal allergies) effectively. Follow these steps daily for best results.  Sinus saline sprays- use nightly, and after sneezing episodes or exposure to allergen.  Insert deeply and spray mist into nose while leaning over sink at 45 degrees,  while gently breathing. Also blow out onto tissue while leaning forward 45 degrees. Once daily, after a sinus rinse, use sensimist.  Just before bedtime is best. This only needed if allergies acting up.  If this is inadequate add-on once daily for levocetirizine / xyzal 5 mg for nondrowsy antihistamine Take benadryl 25 mg at bedtime also if allergic mucus is persisting  When allergies cause chronic swelling in sinuses, it leads to sinus infections:    DAILY TREATMENT ROUTINE   Time of Day Treatment Steps  Morning 1. Saline Nasal Spray - Use to cleanse nasal passages 2. Xyzal (levocetirizine) - Take one tablet daily   Throughout Day Saline Nasal Spray - Use 2 additional times (mid-day and afternoon)   Evening/Bedtime 1. Saline Nasal Rinse - Thoroughly clean nasal passages 2. Flonase  Sensimist - Apply after nasal rinse   Building Your Long-Term Health Plan  During today's preventive visit, we covered a variety of important health checks to help you stay on top of your well-being.  We also discussed strategies to maintain your health and identified some areas that might  benefit from further exploration.   Preventive care visits like today's are designed to be proactive, but sometimes additional attention may be needed.  Rest assured, we're here for you.  If these areas require further evaluation or management, we'd be happy to schedule a separate, focused appointment to address them in detail.  Addressing Next Steps  [x]   Follow-up Visit: To ensure we address any unresolved issues and continue monitoring your overall health, we recommend scheduling a follow-up appointment in 1 year for your next  preventive care visit. If you experience any new problems, need to discuss any medical concerns, or your condition worsens before then, please don't hesitate to call our office to schedule an appointment or seek emergency care as needed.  [x]   Preventive Measures: Maintaining healthy habits plays a crucial role in overall wellness. We recommend considering these tips: [x]   Regular appointments with dental and vision professionals [x]   Nightly nasal saline mist to keep sinuses clear [x]   Consistent toothbrushing to maintain oral health [x]   Using an app like [employer]oreLab to track sleep quality [x]   Routine checks of blood pressure and heart rate [x]   Medical Information: In some instances, we may require additional medical information from other providers to create a comprehensive picture of your health. If applicable, we can provide a medical information release form at the front desk for you to sign, allowing us  to gather these records. [x]   Lab Tests: If any lab tests were ordered today, scheduling them within a week of your visit helps ensure the best possible insurance coverage.  Planning Follow Up to Work on a Problem? Make the Most of Our Focused (20 minute) Appointments  [x]   Clearly state your top concerns at the beginning of the visit to focus our discussion [x]   If you anticipate you will need more time, please inform the front desk during scheduling - we can book  multiple appointments in the same week. [x]   If you have transportation problems- use our convenient video appointments or ask about transportation support. [x]   We can get down to business faster if you use MyChart to update information before the visit and submit non-urgent questions before your visit. Thank you for taking the time to provide details through MyChart.  Let our nurse know and she can import this information into your encounter documents.  Arrival and Wait Times  [x]   Arriving on time ensures that everyone receives prompt attention. [x]   Early morning (8a) and afternoon (1p) appointments tend to have shortest wait times. [x]   Unfortunately, we cannot delay appointments for late arrivals or hold slots during phone calls.  Bring to Your Next Appointment:  [x]   Medications: Please bring all your medication bottles to your next appointment to ensure we have an accurate record of your prescriptions. [x]   Health Diaries: If you're monitoring any health conditions at home, keeping a diary of your readings can be very helpful for discussions at your next appointment.  Reviewing Your Records  [x]   Review your attached preventive care information at the end of these patient instructions. [x]   Review this early draft of your clinical encounter notes below and the final encounter summary tomorrow on MyChart after its been completed.      Getting Answers and Following Up  [x]   Simple Questions & Concerns: For quick questions or basic follow-up after your visit, reach us  at [phone] or MyChart messaging. [x]   Complex Concerns: If your concern is more complex, scheduling an appointment might be best. Discuss this with the staff to find the most suitable option. [x]   Lab & Imaging Results: We'll contact you directly if results are abnormal or you don't use MyChart. Most normal results will be on MyChart within 2-3 business days, with a review message from Dr. Jesus. Haven't heard  back in 2 weeks? Need results sooner? Contact us  at [phone]. [x]   Referrals: Our referral coordinator will manage specialist referrals. The specialist's office should contact you within 2 weeks to schedule an appointment. Call  us  if you haven't heard from them after 2 weeks.  Staying Connected  [x]   MyChart: Activate your MyChart for the fastest way to access results and message us . See the last page of this paperwork for instructions on how to activate.  Billing  [x]   X-ray & Lab Orders: These are billed by separate companies. Contact the invoicing company directly for questions or concerns. [x]   Visit Charges: Discuss any billing inquiries with our administrative services team.  Your Satisfaction Matters  [x]   Share Your Experience: We strive for your satisfaction! If you have any complaints, or preferably compliments, please let Dr. Jesus know directly or contact our Practice Administrators, Manuelita Rubin or [employer], by asking at the front desk.                 Next Steps  [x]   Schedule Follow-Up:  We recommend a follow-up appointment in 1 year for your next wellness visit.  If you develop any new problems, want to address any medical issues, or your condition worsens before then, please call us  for an appointment or seek emergency care. [x]   Preventive Care:  Make sure to keep regular appointments with dental and vision professionals, use nightly nasal saline mist sprays to keep your sinuses clear and toothbrushing to protect your teeth. Use SnoreLab App or other app to track your sleep quality. Check blood pressure and heart rate routinely. [x]   Medical Information Release:  For any relevant medical information we don't have, please sign a release form at the front desk so we can obtain it for your records. [x]   Lab Tests:  Schedule any lab tests from today for within a week to ensure best insurance coverage.    Making the Most of Our Focused (20 minute)  Appointments:  [x]   Clearly state your top concerns at the beginning of the visit to focus our discussion [x]   If you anticipate you will need more time, please inform the front desk during scheduling - we can book multiple appointments in the same week. [x]   If you have transportation problems- use our convenient video appointments or ask about transportation support. [x]   We can get down to business faster if you use MyChart to update information before the visit and submit non-urgent questions before your visit. Thank you for taking the time to provide details through MyChart.  Let our nurse know and she can import this information into your encounter documents.  Arrival and Wait Times: [x]   Arriving on time ensures that everyone receives prompt attention. [x]   Early morning (8a) and afternoon (1p) appointments tend to have shortest wait times. [x]   Unfortunately, we cannot delay appointments for late arrivals or hold slots during phone calls.  Bring to Your Next Appointment  [x]   Medications: Please bring all your medication bottles to your next appointment to ensure we have an accurate record of your prescriptions. [x]   Health Diaries: If you're monitoring any health conditions at home, keeping a diary of your readings can be very helpful for discussions at your next appointment.  Reviewing Your Records  [x]   Review your attached preventive care information at the end of these patient instructions. [x]   Review this early draft of your clinical encounter notes below and the final encounter summary tomorrow on MyChart after its been completed.   Encounter for annual general medical examination with abnormal findings in adult  Chronic pansinusitis     Getting Answers and Following Up  [x]   Simple Questions &  Concerns: For quick questions or basic follow-up after your visit, reach us  at [phone] or MyChart messaging. [x]   Complex Concerns: If your concern is more complex,  scheduling an appointment might be best. Discuss this with the staff to find the most suitable option. [x]   Lab & Imaging Results: We'll contact you directly if results are abnormal or you don't use MyChart. Most normal results will be on MyChart within 2-3 business days, with a review message from Dr. Jesus. Haven't heard back in 2 weeks? Need results sooner? Contact us  at [phone]. [x]   Referrals: Our referral coordinator will manage specialist referrals. The specialist's office should contact you within 2 weeks to schedule an appointment. Call us  if you haven't heard from them after 2 weeks.  Staying Connected  [x]   MyChart: Activate your MyChart for the fastest way to access results and message us . See the last page of this paperwork for instructions on how to activate.  Billing  [x]   X-ray & Lab Orders: These are billed by separate companies. Contact the invoicing company directly for questions or concerns. [x]   Visit Charges: Discuss any billing inquiries with our administrative services team.  Your Satisfaction Matters  [x]   Share Your Experience: We strive for your satisfaction! If you have any complaints, or preferably compliments, please let Dr. Jesus know directly or contact our Practice Administrators, Manuelita Rubin or [employer], by asking at the front desk.    Medical Screening Exam A medical screening exam (MSE) helps to determine whether you need immediate medical treatment relating to any number of symptoms you are having. This type of exam may be done in an emergency department, an urgent care setting, or your health care provider's office. Depending on your symptoms and severity, you may need additional tests or medical therapy. It is important to note that an MSE does not necessarily mean that you will need or receive further medical testing or interventions if your symptoms are not deemed to be medically urgent (emergent). Tell a health care provider  about: Any allergies you have. All medicines you are taking, including vitamins, herbs, eye drops, creams, and over-the-counter medicines. Any problems you or family members have had with anesthetic medicines. Any bleeding problems you have. Any surgeries you have had. Any medical conditions you have. Whether you are pregnant or may be pregnant. What happens during the test? During the exam, a health care provider does a short, often focused, physical exam and asks about your medical history to assess: Your current symptoms. Your overall health. Your need for possible further medical intervention. What can I expect after the test? If you have a regular health care provider, make an appointment for a follow-up visit with him or her. If you do not have a regular health care provider, ask about resources in your community. Your medical screening exam may determine that: You do not need emergency treatment at this time. You need treatment right away. You need to be transferred to another medical center. This may happen if you need an emergent specialist or consultant that is not available at the medical center you are at. You need to have more tests. A medical specialist may be consulted if needed. Get help right away if: Your condition gets worse. You develop new or troubling symptoms before you see your health care provider. These symptoms may represent a serious problem that is an emergency. Do not wait to see if the symptoms will go away. Get medical help right away.  Call your local emergency services (911 in the [place] Do not drive yourself to the hospital. Summary A medical screening exam helps to determine whether you need medical treatment right away. This type of exam may be done in an emergency department, an urgent care setting, or your health care provider's office. During the exam, a health care provider does a short physical exam and asks about your current symptoms and overall  health. Depending on the exam, more tests or therapies may be ordered. However, an MSE does not necessarily mean that you will have further medical testing if your symptoms are not deemed to be urgent. If you need further care that is not offered at your current medical center, you may need to be transferred to another facility. This information is not intended to replace advice given to you by your health care provider. Make sure you discuss any questions you have with your health care provider. Document Revised: 01/09/2021 Document Reviewed: 09/06/2020 Elsevier Patient Education  2024 [employer].    Building Your Long-Term Health Plan  During today's preventive visit, we covered a variety of important health checks to help you stay on top of your well-being.  We also discussed strategies to maintain your health and identified some areas that might benefit from further exploration.   Preventive care visits like today's are designed to be proactive, but sometimes additional attention may be needed.  Rest assured, we're here for you.  If these areas require further evaluation or management, we'd be happy to schedule a separate, focused appointment to address them in detail.  Addressing Next Steps  [x]   Follow-up Visit: To ensure we address any unresolved issues and continue monitoring your overall health, we recommend scheduling a follow-up appointment in 1 year for your next preventive care visit. If you experience any new problems, need to discuss any medical concerns, or your condition worsens before then, please don't hesitate to call our office to schedule an appointment or seek emergency care as needed.  [x]   Preventive Measures: Maintaining healthy habits plays a crucial role in overall wellness. We recommend considering these tips: [x]   Regular appointments with dental and vision professionals [x]   Nightly nasal saline mist to keep sinuses clear [x]   Consistent toothbrushing to maintain  oral health [x]   Using an app like [employer]oreLab to track sleep quality [x]   Routine checks of blood pressure and heart rate [x]   Medical Information: In some instances, we may require additional medical information from other providers to create a comprehensive picture of your health. If applicable, we can provide a medical information release form at the front desk for you to sign, allowing us  to gather these records. [x]   Lab Tests: If any lab tests were ordered today, scheduling them within a week of your visit helps ensure the best possible insurance coverage.  Planning Follow Up to Work on a Problem? Make the Most of Our Focused (20 minute) Appointments  [x]   Clearly state your top concerns at the beginning of the visit to focus our discussion [x]   If you anticipate you will need more time, please inform the front desk during scheduling - we can book multiple appointments in the same week. [x]   If you have transportation problems- use our convenient video appointments or ask about transportation support. [x]   We can get down to business faster if you use MyChart to update information before the visit and submit non-urgent questions before your visit. Thank you for taking the time to provide  details through MyChart.  Let our nurse know and she can import this information into your encounter documents.  Arrival and Wait Times  [x]   Arriving on time ensures that everyone receives prompt attention. [x]   Early morning (8a) and afternoon (1p) appointments tend to have shortest wait times. [x]   Unfortunately, we cannot delay appointments for late arrivals or hold slots during phone calls.  Bring to Your Next Appointment:  [x]   Medications: Please bring all your medication bottles to your next appointment to ensure we have an accurate record of your prescriptions. [x]   Health Diaries: If you're monitoring any health conditions at home, keeping a diary of your readings can be very helpful for  discussions at your next appointment.  Reviewing Your Records  [x]   Review your attached preventive care information at the end of these patient instructions. [x]   Review this early draft of your clinical encounter notes below and the final encounter summary tomorrow on MyChart after its been completed.      Getting Answers and Following Up  [x]   Simple Questions & Concerns: For quick questions or basic follow-up after your visit, reach us  at [phone] or MyChart messaging. [x]   Complex Concerns: If your concern is more complex, scheduling an appointment might be best. Discuss this with the staff to find the most suitable option. [x]   Lab & Imaging Results: We'll contact you directly if results are abnormal or you don't use MyChart. Most normal results will be on MyChart within 2-3 business days, with a review message from Dr. Jesus. Haven't heard back in 2 weeks? Need results sooner? Contact us  at [phone]. [x]   Referrals: Our referral coordinator will manage specialist referrals. The specialist's office should contact you within 2 weeks to schedule an appointment. Call us  if you haven't heard from them after 2 weeks.  Staying Connected  [x]   MyChart: Activate your MyChart for the fastest way to access results and message us . See the last page of this paperwork for instructions on how to activate.  Billing  [x]   X-ray & Lab Orders: These are billed by separate companies. Contact the invoicing company directly for questions or concerns. [x]   Visit Charges: Discuss any billing inquiries with our administrative services team.  Your Satisfaction Matters  [x]   Share Your Experience: We strive for your satisfaction! If you have any complaints, or preferably compliments, please let Dr. Jesus know directly or contact our Practice Administrators, Manuelita Rubin or [employer], by asking at the front desk.                 Next Steps  [x]   Schedule Follow-Up:  We  recommend a follow-up appointment in 1 year for your next wellness visit.  If you develop any new problems, want to address any medical issues, or your condition worsens before then, please call us  for an appointment or seek emergency care. [x]   Preventive Care:  Make sure to keep regular appointments with dental and vision professionals, use nightly nasal saline mist sprays to keep your sinuses clear and toothbrushing to protect your teeth. Use Snore[employer] App or other app to track your sleep quality. Check blood pressure and heart rate routinely. [x]   Medical Information Release:  For any relevant medical information we don't have, please sign a release form at the front desk so we can obtain it for your records. [x]   Lab Tests:  Schedule any lab tests from today for within a week to ensure best insurance coverage.  Making the Most of Our Focused (20 minute) Appointments:  [x]   Clearly state your top concerns at the beginning of the visit to focus our discussion [x]   If you anticipate you will need more time, please inform the front desk during scheduling - we can book multiple appointments in the same week. [x]   If you have transportation problems- use our convenient video appointments or ask about transportation support. [x]   We can get down to business faster if you use MyChart to update information before the visit and submit non-urgent questions before your visit. Thank you for taking the time to provide details through MyChart.  Let our nurse know and she can import this information into your encounter documents.  Arrival and Wait Times: [x]   Arriving on time ensures that everyone receives prompt attention. [x]   Early morning (8a) and afternoon (1p) appointments tend to have shortest wait times. [x]   Unfortunately, we cannot delay appointments for late arrivals or hold slots during phone calls.  Bring to Your Next Appointment  [x]   Medications: Please bring all your medication bottles to  your next appointment to ensure we have an accurate record of your prescriptions. [x]   Health Diaries: If you're monitoring any health conditions at home, keeping a diary of your readings can be very helpful for discussions at your next appointment.  Reviewing Your Records  [x]   Review your attached preventive care information at the end of these patient instructions. [x]   Review this early draft of your clinical encounter notes below and the final encounter summary tomorrow on MyChart after its been completed.   Encounter for annual general medical examination with abnormal findings in adult  Chronic pansinusitis     Getting Answers and Following Up  [x]   Simple Questions & Concerns: For quick questions or basic follow-up after your visit, reach us  at [phone] or MyChart messaging. [x]   Complex Concerns: If your concern is more complex, scheduling an appointment might be best. Discuss this with the staff to find the most suitable option. [x]   Lab & Imaging Results: We'll contact you directly if results are abnormal or you don't use MyChart. Most normal results will be on MyChart within 2-3 business days, with a review message from Dr. Jesus. Haven't heard back in 2 weeks? Need results sooner? Contact us  at [phone]. [x]   Referrals: Our referral coordinator will manage specialist referrals. The specialist's office should contact you within 2 weeks to schedule an appointment. Call us  if you haven't heard from them after 2 weeks.  Staying Connected  [x]   MyChart: Activate your MyChart for the fastest way to access results and message us . See the last page of this paperwork for instructions on how to activate.  Billing  [x]   X-ray & Lab Orders: These are billed by separate companies. Contact the invoicing company directly for questions or concerns. [x]   Visit Charges: Discuss any billing inquiries with our administrative services team.  Your Satisfaction Matters  [x]   Share  Your Experience: We strive for your satisfaction! If you have any complaints, or preferably compliments, please let Dr. Jesus know directly or contact our Practice Administrators, Manuelita Rubin or [employer], by asking at the front desk.    Medical Screening Exam A medical screening exam (MSE) helps to determine whether you need immediate medical treatment relating to any number of symptoms you are having. This type of exam may be done in an emergency department, an urgent care setting, or your health care provider's office.  Depending on your symptoms and severity, you may need additional tests or medical therapy. It is important to note that an MSE does not necessarily mean that you will need or receive further medical testing or interventions if your symptoms are not deemed to be medically urgent (emergent). Tell a health care provider about: Any allergies you have. All medicines you are taking, including vitamins, herbs, eye drops, creams, and over-the-counter medicines. Any problems you or family members have had with anesthetic medicines. Any bleeding problems you have. Any surgeries you have had. Any medical conditions you have. Whether you are pregnant or may be pregnant. What happens during the test? During the exam, a health care provider does a short, often focused, physical exam and asks about your medical history to assess: Your current symptoms. Your overall health. Your need for possible further medical intervention. What can I expect after the test? If you have a regular health care provider, make an appointment for a follow-up visit with him or her. If you do not have a regular health care provider, ask about resources in your community. Your medical screening exam may determine that: You do not need emergency treatment at this time. You need treatment right away. You need to be transferred to another medical center. This may happen if you need an emergent specialist  or consultant that is not available at the medical center you are at. You need to have more tests. A medical specialist may be consulted if needed. Get help right away if: Your condition gets worse. You develop new or troubling symptoms before you see your health care provider. These symptoms may represent a serious problem that is an emergency. Do not wait to see if the symptoms will go away. Get medical help right away. Call your local emergency services (911 in the [place] Do not drive yourself to the hospital. Summary A medical screening exam helps to determine whether you need medical treatment right away. This type of exam may be done in an emergency department, an urgent care setting, or your health care provider's office. During the exam, a health care provider does a short physical exam and asks about your current symptoms and overall health. Depending on the exam, more tests or therapies may be ordered. However, an MSE does not necessarily mean that you will have further medical testing if your symptoms are not deemed to be urgent. If you need further care that is not offered at your current medical center, you may need to be transferred to another facility. This information is not intended to replace advice given to you by your health care provider. Make sure you discuss any questions you have with your health care provider. Document Revised: 01/09/2021 Document Reviewed: 09/06/2020 Elsevier Patient Education  2024 [employer]  3. Benadryl (diphenhydramine) - Take 25mg  if experiencing persistent congestion    PROPER TECHNIQUE GUIDE       Saline Nasal Spray/Rinse Technique: Lean forward over sink at a 45-degree angle Turn head slightly to one side Insert spray tip into upper nostril Spray gently while breathing lightly through your nose Repeat on other side Gently blow nose to clear excess solution Use saline spray 3 times daily to keep nasal passages moist and clear allergens.        Flonase  Sensimist Technique: Shake bottle gently before each use Prime the bottle if it's new or hasn't been used for a week Tilt your head forward slightly Insert tip into nostril, pointing away from the center of  your nose Spray while inhaling gently Repeat in other nostril Use Flonase  Sensimist once daily, preferably at bedtime after using saline rinse. It may take several days of regular use to feel maximum benefit.   WHY FLONASE  SENSIMIST?   Benefits of Flonase  Sensimist:  Alcohol-free and scent-free formula - gentler on sensitive nasal passages Fine mist application - more comfortable with less dripping down throat Effectively relieves nasal congestion, sneezing, runny nose, and even eye symptoms 24-hour relief with once-daily dosing Uses a more potent form of fluticasone  that works at a lower dose Less liquid per spray means less discomfort  UNDERSTANDING YOUR MEDICATIONS   Medication How It Works Important Notes  Flonase  Sensimist (fluticasone  furoate) Reduces inflammation in nasal passages, addressing the underlying cause of allergy symptoms - Takes several days for full effect - Use daily for best results - Safe for long-term use   Xyzal (levocetirizine) Blocks histamine to reduce allergy symptoms like sneezing and itching - Take at the same time each day - May cause drowsiness in some people - Once-daily dosing   Benadryl (diphenhydramine) Antihistamine that provides additional relief for breakthrough symptoms - Causes drowsiness - Use only at bedtime - For occasional use when needed   Saline Spray/Rinse Physically removes allergens and moistens nasal passages - Safe to use frequently - Improves effectiveness of other treatments - Reduces nasal irritation    CONTACT YOUR PROVIDER IF: Your symptoms do not improve after 1-2 weeks of following this plan You develop sinus pain with fever or green/yellow discharge You experience frequent nosebleeds You develop  new or worsening symptoms You have questions about your treatment plan     ADDITIONAL ALLERGY MANAGEMENT TIPS   HELPFUL STRATEGIES: ?? Keep windows closed during high pollen seasons ??? Use allergen-proof covers for pillows and mattresses ?? Vacuum regularly with a HEPA filter vacuum ?? Shower and change clothes after spending time outdoors ?? Check local pollen counts and limit outdoor time when counts are high ?? Stay well-hydrated to help keep mucous membranes moist

## 2024-01-21 NOTE — Assessment & Plan Note (Signed)
 Chronic pansinusitis with allergic rhinitis is exacerbated by occupational pollen exposure, leading to nasal congestion and rhinorrhea. A possible bacterial infection is considered, primarily due to allergies, which may also contribute to sleep disturbances. Prescribe doxycycline  for potential bacterial infection. Recommend saline rinses, Flonase  sprays, Claritin , and Sudafed for symptom management. Provide a sinus care handout.

## 2024-01-21 NOTE — Progress Notes (Signed)
 [facility] [address] [place], [place] [address] Office:  [phone]  -- Annual Preventive Medical Office Visit --  Patient:  Phillip Shaw      Age: 38 y.o.       Sex:  male  Date:   01/21/2024 Patient Care Team: Jesus Bernardino MATSU, MD as PCP - General (Internal Medicine) Today's Healthcare Provider: Bernardino MATSU Jesus, MD  ========================================= Chief complaint: Medical Management of Chronic Issues  Purpose of Visit: Comprehensive preventive health assessment and personalized health maintenance planning.  This encounter was conducted as a Comprehensive Physical Exam (CPE) preventive care annual visit. The patient's medical history and problem list were reviewed to inform individualized preventive care recommendations.   No problem-specific medical treatment was provided during this visit.  Assessment & Plan Encounter for annual general medical examination with abnormal findings in adult An annual physical examination was conducted, emphasizing the importance of annual physicals for insurance benefits and health monitoring. Abnormalities were noted during the examination. Conduct annual physical examination and discuss insurance benefits of annual physicals. Chronic pansinusitis Chronic pansinusitis with allergic rhinitis is exacerbated by occupational pollen exposure, leading to nasal congestion and rhinorrhea. A possible bacterial infection is considered, primarily due to allergies, which may also contribute to sleep disturbances. Prescribe doxycycline  for potential bacterial infection. Recommend saline rinses, Flonase  sprays, Claritin , and Sudafed for symptom management. Provide a sinus care handout. Degeneration of intervertebral disc of lumbar region with discogenic back pain Chronic back pain results from lumbar disc degeneration. Weight loss has provided some relief. He is advised against heavy manual labor to prevent further  damage. TRX suspension training is recommended as a suitable exercise option that avoids spinal compression. Encourage weight loss to alleviate back pain. Class 1 obesity due to excess calories without serious comorbidity in adult, unspecified BMI Class 2 obesity with a BMI of 39.0 to 39.9. Weight loss has been achieved, but further reduction is needed. Emphasize the importance of exercise, dietary management, and maintaining muscle mass during weight loss. Recommend TRX suspension training for exercise. Snoring Suspected sleep apnea with improved symptoms due to weight loss. A previous sleep study referral was declined, and current symptoms do not warrant an immediate sleep study. Weight loss and allergy management may further improve symptoms. Monitor symptoms and consider a sleep study if symptoms worsen. 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 Current treatment includes oral antibiotics. Discuss potential photosensitivity with certain treatments. Consider additional topical treatments if needed. Nicotine use Nicotine use through oral pouches is discussed with a focus on tapering off to save money and address potential dental health risks. No known health risks are associated with oral pouches at this time. Recommend tapering off to prevent nicotine nausea and other potential side effects. Discuss dental health monitoring.    ICD-10-CM   1. Encounter for annual general medical examination with abnormal findings in adult  Z00.01     2. Chronic pansinusitis  J32.4 fluticasone  (FLONASE ) 50 MCG/ACT nasal spray    Saline (SIMPLY SALINE) 0.9 % AERS    loratadine  (CLARITIN ) 10 MG tablet    pseudoephedrine  (SUDAFED 12 HOUR) 120 MG 12 hr tablet    doxycycline  (VIBRA -TABS) 100 MG tablet    3. Degeneration of intervertebral disc of lumbar region with discogenic back pain  M51.360     4. Class 1 obesity due to excess calories without serious  comorbidity in adult, unspecified BMI  Z33.188  E66.09     5. Snoring  R06.83     6. Comedonal acne  L70.0     7. Nicotine use  Z72.0       Reviewed/updated/encouraged completion: Immunization History  Administered Date(s) Administered   Tdap 07/21/2022   Health Maintenance Due  Topic Date Due   Hepatitis B Vaccines 19-59 Average Risk (1 of 3 - 19+ 3-dose series) Never done   HPV VACCINES (1 - 3-dose SCDM series) Never done  Patient declined to move forward with this vaccine suggestion; has full decision making capacity    Health Maintenance  Topic Date Due   Hepatitis B Vaccines 19-59 Average Risk (1 of 3 - 19+ 3-dose series) Never done   HPV VACCINES (1 - 3-dose SCDM series) Never done   DTaP/Tdap/Td (2 - Td or Tdap) 07/20/2032   Hepatitis C Screening  Completed   HIV Screening  Completed   Pneumococcal Vaccine  Aged Out   Meningococcal B Vaccine  Aged Out   Influenza Vaccine  Discontinued   COVID-19 Vaccine  Discontinued    Reviewed the following verbally with patient and provided AVS materials:   HEALTH MAINTENANCE COUNSELING AND ANTICIPATORY GUIDANCE    Preventive Measure Recommendation  Eye Exams Every 1-2 years  Dental Care Cleanings every 6 months or more, brush/floss 3x daily  Sinus Care Saline spray rinses daily  Sleep 8 hours nightly, good sleep hygiene, e-monitoring if any daytime drowsiness  Diet Fruits/vegetables/fiber/healthy fats, balance and moderation  Exercise 150 minutes weekly  Risk Behaviors Discouraged any/all high risk behaviors    CANCER SCREENING SHARED DECISION MAKING    Penile/Testicle/Scrotum Encouraged self-monitoring and reporting of genital abnormalities. Patient reports none.  Thyroid  Checked and advised to palpate thyroid  for nodules  Prostate Individualized risks/benefits/costs discussed No results found for: PSA  Colon HM Colonoscopy   This patient has no relevant Health Maintenance data.     Lung Current guidelines  recommend individuals aged 5 to 59 who currently smoke or formerly smoked and have a >= 20 pack-year smoking history should undergo annual screening with low-dose computed tomography (LDCT). Tobacco Use: Medium Risk (01/21/2024)   Patient History    Smoking Tobacco Use: Former    Smokeless Tobacco Use: Never    Passive Exposure: Not on file     Skin Advised regular sunscreen use. Patient denies worrisome, changing, or new skin lesions. Offered to include images in chart for surveillance. Showed patient these pictures of melanomas for reference to educate for self-monitoring.    Discussed the use of AI scribe software for clinical note transcription with the patient, who gave verbal consent to proceed.  History of Present Illness 38 year old male who presents for a follow-up appointment for medical management of hearing, smoking, weight, seborrheic dermatitis, and snoring.  He continues to use oral nicotine pouches as an alternative to smoking. He discusses various brands and their effects, noting that some can cause 'nicotine nauseous' feelings.  He has a history of seborrheic dermatitis and snoring. He previously declined a sleep study referral but is open to reconsidering it. Weight loss has improved his snoring and overall sleep quality.  He has been using Zepbound  for weight management but has not taken it in about a month. He plans to restart at a low dose. He reports a significant weight loss of about 20-30 pounds, which he attributes to lifestyle changes and medication.  He experiences back pain, which he believes has improved with weight loss. He works in Aeronautical [employer],  which involves physical labor that may exacerbate his back issues.  He reports a history of acne on his back ('bacne'), which has not changed much over time. He is aware of potential treatments but has not pursued them recently.  He has allergies, with symptoms including a runny nose and sneezing, especially when  exposed to pollen. No fever but feels 'super sweaty' and attributes it to allergies.  ROS A comprehensive ROS was negative for any concerning symptoms.   Completed medication reconciliation: Current Outpatient Medications on File Prior to Visit  Medication Sig   adapalene  (DIFFERIN ) 0.1 % cream Apply topically at bedtime.   albuterol  (VENTOLIN  HFA) 108 (90 Base) MCG/ACT inhaler Inhale 2 puffs into the lungs every 6 (six) hours as needed for wheezing or shortness of breath.   benzoyl peroxide  10 % LIQD Apply 1 tablet topically daily at 6 (six) AM.   ketoconazole  (NIZORAL ) 2 % cream Apply 1 Application topically 2 (two) times daily.   minocycline  (MINOCIN ) 100 MG capsule Take 1 capsule (100 mg total) by mouth daily.   tirzepatide  (ZEPBOUND ) 5 MG/0.5ML Pen Inject 5 mg into the skin once a week. Start after 1 more month on 2.5 mg weekly.   ondansetron  (ZOFRAN -ODT) 4 MG disintegrating tablet Take 1 tablet (4 mg total) by mouth every 8 (eight) hours as needed for nausea or vomiting. (Patient not taking: Reported on 01/21/2024)   No current facility-administered medications on file prior to visit.  There are no discontinued medications.The following were reviewed and/or entered/updated into our electronic MEDICAL RECORD NUMBERPast Medical History:  Diagnosis Date   Arthritis 07/02/2022   Especially in left ankle/foot   Hearing loss 07/02/2022   Past Surgical History:  Procedure Laterality Date   WISDOM TOOTH EXTRACTION     Social History   Socioeconomic History   Marital status: Single    Spouse name: Not on file   Number of children: Not on file   Years of education: Not on file   Highest education level: Not on file  Occupational History   Not on file  Tobacco Use   Smoking status: Former    Current packs/day: 0.00    Average packs/day: 1 pack/day for 14.0 years (14.0 ttl pk-yrs)    Types: Cigarettes    Start date: 05/01/2001    Quit date: 05/02/2015    Years since quitting: 8.7    Smokeless tobacco: Never  Vaping Use   Vaping status: Never Used  Substance and Sexual Activity   Alcohol use: Not Currently    Alcohol/week: 12.0 standard drinks of alcohol    Types: 12 Cans of beer per week    Comment: socially   Drug use: Not Currently    Types: Marijuana    Comment: occasional   Sexual activity: Yes    Birth control/protection: None  Other Topics Concern   Not on file  Social History Narrative   Not on file   Social Drivers of Health   Financial Resource Strain: Not on file  Food Insecurity: Not on file  Transportation Needs: Not on file  Physical Activity: Not on file  Stress: Not on file  Social Connections: Not on file  Intimate Partner Violence: Not on file       No data to display         Family History  Problem Relation Age of Onset   Early death Mother    Drug abuse Mother    Diabetes Mother    Depression Mother  Cancer Mother    Early death Sister    Cancer Sister    Cancer Maternal Grandmother    Arthritis Maternal Grandmother    Cancer Maternal Grandfather    Cancer Other    Diabetes Other   No Known Allergies Social History   Substance and Sexual Activity  Sexual Activity Yes   Birth control/protection: None  @    09/08/2023    8:53 AM  Depression screen PHQ 2/9  Decreased Interest 0  Down, Depressed, Hopeless 0  PHQ - 2 Score 0      09/08/2023    8:53 AM  Fall Risk   Falls in the past year? 0  Number falls in past yr: 0  Injury with Fall? 0     BP 138/80   Pulse 76   Temp 97.8 F (36.6 C) (Temporal)   Ht 5' 9 (1.753 m)   Wt 228 lb (103.4 kg)   SpO2 98%   BMI 33.67 kg/m  BP Readings from Last 3 Encounters:  01/21/24 138/80  09/08/23 128/74  07/22/23 123/81   Wt Readings from Last 10 Encounters:  01/21/24 228 lb (103.4 kg)  09/08/23 252 lb 6.4 oz (114.5 kg)  07/22/23 270 lb 12.8 oz (122.8 kg)  07/21/22 244 lb (110.7 kg)  07/02/22 242 lb 3.2 oz (109.9 kg)  11/30/19 250 lb (113.4 kg)  12/10/15  250 lb (113.4 kg)  12/13/14 237 lb (107.5 kg)  11/03/12 222 lb (100.7 kg)  Physical Exam  Physical Exam HEENT:  NEUROLOGICAL:  SKIN:   GEN: No acute distress, resting comfortably. HEENT: Tympanic membranes normal appearing bilaterally, oropharynx clear, no thyromegaly noted, Nasal mucosa shows signs of allergies. Dental erosions present.no palpable lymphadenopathy or thyroid  nodules. CARDIOVASCULAR: S1 and S2 heart sounds with regular rate and rhythm, no murmurs appreciated. PULMONARY: Normal work of breathing, clear to auscultation bilaterally, no crackles, wheezes, or rhonchi. ABDOMEN: Soft, nontender, nondistended. MSK: No edema, cyanosis, or clubbing noted. SKIN: Warm, dry, no lesions of concern observed.Acne present on back. NEUROLOGICAL: Cranial nerves II-XII grossly intact, strength 5/5 in upper and lower extremities, rAbsent reflexes. PSYCH: Normal affect and thought content, pleasant and cooperative.   ======================================  IMPORTANT HEALTH REMINDERS: Report any new or changing skin lesions promptly Maintain recommended screening schedules Discuss any new family history of cancer at future visits Follow up on any new symptoms that persist more than two weeks      Notes:  This document was synthesized by artificial intelligence ([employer]bridge) using HIPAA-compliant recording of the clinical interaction;   We discussed the use of AI scribe software for clinical note transcription with the patient, who gave verbal consent to proceed.    This encounter employed state-of-the-art, real-time, collaborative documentation. The patient was empowered to actively review and assist in updating their electronic medical record on a shared monitor, ensuring transparency and improving accuracy.    Prior to and at the beginning of Comprehensive Physical Exam (CPE) preventive care annual visit appointment types  we clarify to patients Our goal today is to focus on your  preventive or annual Comprehensive Physical Exam (CPE) preventive care annual visit, which typically covers routine screenings and overall health maintenance. However, if you share any new or concerning symptoms--such as dizziness, passing out, severe pain, or anything else that may point to a more serious issue--we are both legally and ethically required to evaluate it. We cannot simply overlook or ignore such concerns, even if you later decide you don't want to discuss them,  because it could jeopardize your health.  If addressing a new concern takes us  beyond the scope of the preventive visit, we may need to bill separately for that portion of care. We understand financial considerations are important, and we're happy to discuss your options if something new comes up. However, we want to be clear that once you mention a potentially serious issue, we must investigate it; we can't ethically or legally exclude that from our records or our evaluation. Please let us  know all of your questions or worries. Together, we can decide how best to manage them and how to minimize any unexpected costs, but we want to keep you safe above all else.   This disclosure is mandated by professional ethics and legal obligations, as healthcare providers must address any substantial health concerns raised during any patient interaction and a comprehensive ROS is required by insurance companies for billing preventive-care visit type.   This disclosure ultimately discourages patients financially from reporting significant health issues.   Medical Screening Exam A medical screening exam (MSE) helps to determine whether you need immediate medical treatment relating to any number of symptoms you are having. This type of exam may be done in an emergency department, an urgent care setting, or your health care provider's office. Depending on your symptoms and severity, you may need additional tests or medical therapy. It is important to  note that an MSE does not necessarily mean that you will need or receive further medical testing or interventions if your symptoms are not deemed to be medically urgent (emergent). Tell a health care provider about: Any allergies you have. All medicines you are taking, including vitamins, herbs, eye drops, creams, and over-the-counter medicines. Any problems you or family members have had with anesthetic medicines. Any bleeding problems you have. Any surgeries you have had. Any medical conditions you have. Whether you are pregnant or may be pregnant. What happens during the test? During the exam, a health care provider does a short, often focused, physical exam and asks about your medical history to assess: Your current symptoms. Your overall health. Your need for possible further medical intervention. What can I expect after the test? If you have a regular health care provider, make an appointment for a follow-up visit with him or her. If you do not have a regular health care provider, ask about resources in your community. Your medical screening exam may determine that: You do not need emergency treatment at this time. You need treatment right away. You need to be transferred to another medical center. This may happen if you need an emergent specialist or consultant that is not available at the medical center you are at. You need to have more tests. A medical specialist may be consulted if needed. Get help right away if: Your condition gets worse. You develop new or troubling symptoms before you see your health care provider. These symptoms may represent a serious problem that is an emergency. Do not wait to see if the symptoms will go away. Get medical help right away. Call your local emergency services (911 in the [place] Do not drive yourself to the hospital. Summary A medical screening exam helps to determine whether you need medical treatment right away. This type of exam may be done  in an emergency department, an urgent care setting, or your health care provider's office. During the exam, a health care provider does a short physical exam and asks about your current symptoms and overall health. Depending on the  exam, more tests or therapies may be ordered. However, an MSE does not necessarily mean that you will have further medical testing if your symptoms are not deemed to be urgent. If you need further care that is not offered at your current medical center, you may need to be transferred to another facility. This information is not intended to replace advice given to you by your health care provider. Make sure you discuss any questions you have with your health care provider. Document Revised: 01/09/2021 Document Reviewed: 09/06/2020 Elsevier Patient Education  2024 [employer]   Health Maintenance, Male Adopting a healthy lifestyle and getting preventive care are important in promoting health and wellness. Ask your health care provider about: The right schedule for you to have regular tests and exams. Things you can do on your own to prevent diseases and keep yourself healthy. What should I know about diet, weight, and exercise? Eat a healthy diet  Eat a diet that includes plenty of vegetables, fruits, low-fat dairy products, and lean protein. Do not eat a lot of foods that are high in solid fats, added sugars, or sodium. Maintain a healthy weight Body mass index (BMI) is a measurement that can be used to identify possible weight problems. It estimates body fat based on height and weight. Your health care provider can help determine your BMI and help you achieve or maintain a healthy weight. Get regular exercise Get regular exercise. This is one of the most important things you can do for your health. Most adults should: Exercise for at least 150 minutes each week. The exercise should increase your heart rate and make you sweat (moderate-intensity exercise). Do  strengthening exercises at least twice a week. This is in addition to the moderate-intensity exercise. Spend less time sitting. Even light physical activity can be beneficial. Watch cholesterol and blood lipids Have your blood tested for lipids and cholesterol at 40 years of age, then have this test every 5 years. You may need to have your cholesterol levels checked more often if: Your lipid or cholesterol levels are high. You are older than 40 years of age. You are at high risk for heart disease. What should I know about cancer screening? Many types of cancers can be detected early and may often be prevented. Depending on your health history and family history, you may need to have cancer screening at various ages. This may include screening for: Colorectal cancer. Prostate cancer. Skin cancer. Lung cancer. What should I know about heart disease, diabetes, and high blood pressure? Blood pressure and heart disease High blood pressure causes heart disease and increases the risk of stroke. This is more likely to develop in people who have high blood pressure readings or are overweight. Talk with your health care provider about your target blood pressure readings. Have your blood pressure checked: Every 3-5 years if you are 93-23 years of age. Every year if you are 23 years old or older. If you are between the ages of 52 and 74 and are a current or former smoker, ask your health care provider if you should have a one-time screening for abdominal aortic aneurysm (AAA). Diabetes Have regular diabetes screenings. This checks your fasting blood sugar level. Have the screening done: Once every three years after age 61 if you are at a normal weight and have a low risk for diabetes. More often and at a younger age if you are overweight or have a high risk for diabetes. What should I know about  preventing infection? Hepatitis B If you have a higher risk for hepatitis B, you should be screened for  this virus. Talk with your health care provider to find out if you are at risk for hepatitis B infection. Hepatitis C Blood testing is recommended for: Everyone born from [birth year] through 1967. Anyone with known risk factors for hepatitis C. Sexually transmitted infections (STIs) You should be screened each year for STIs, including gonorrhea and chlamydia, if: You are sexually active and are younger than 40 years of age. You are older than 40 years of age and your health care provider tells you that you are at risk for this type of infection. Your sexual activity has changed since you were last screened, and you are at increased risk for chlamydia or gonorrhea. Ask your health care provider if you are at risk. Ask your health care provider about whether you are at high risk for HIV. Your health care provider may recommend a prescription medicine to help prevent HIV infection. If you choose to take medicine to prevent HIV, you should first get tested for HIV. You should then be tested every 3 months for as long as you are taking the medicine. Follow these instructions at home: Alcohol use Do not drink alcohol if your health care provider tells you not to drink. If you drink alcohol: Limit how much you have to 0-2 drinks a day. Know how much alcohol is in your drink. In the [place] one drink equals one 12 oz bottle of beer (355 mL), one 5 oz glass of wine (148 mL), or one 1 oz glass of hard liquor (44 mL). Lifestyle Do not use any products that contain nicotine or tobacco. These products include cigarettes, chewing tobacco, and vaping devices, such as e-cigarettes. If you need help quitting, ask your health care provider. Do not use street drugs. Do not share needles. Ask your health care provider for help if you need support or information about quitting drugs. General instructions Schedule regular health, dental, and eye exams. Stay current with your vaccines. Tell your health care provider  if: You often feel depressed. You have ever been abused or do not feel safe at home. Summary Adopting a healthy lifestyle and getting preventive care are important in promoting health and wellness. Follow your health care provider's instructions about healthy diet, exercising, and getting tested or screened for diseases. Follow your health care provider's instructions on monitoring your cholesterol and blood pressure. This information is not intended to replace advice given to you by your health care provider. Make sure you discuss any questions you have with your health care provider. Document Revised: 09/17/2020 Document Reviewed: 09/17/2020 Elsevier Patient Education  2024 [employer].

## 2024-01-21 NOTE — Assessment & Plan Note (Signed)
 Suspected sleep apnea with improved symptoms due to weight loss. A previous sleep study referral was declined, and current symptoms do not warrant an immediate sleep study. Weight loss and allergy management may further improve symptoms. Monitor symptoms and consider a sleep study if symptoms worsen.

## 2024-01-21 NOTE — Assessment & Plan Note (Signed)
 Class 2 obesity with a BMI of 39.0 to 39.9. Weight loss has been achieved, but further reduction is needed. Emphasize the importance of exercise, dietary management, and maintaining muscle mass during weight loss. Recommend TRX suspension training for exercise.

## 2024-03-08 ENCOUNTER — Telehealth: Payer: Self-pay | Admitting: Internal Medicine

## 2024-03-08 NOTE — Telephone Encounter (Deleted)
 LVM TO RS 01/23/2025

## 2024-03-08 NOTE — Telephone Encounter (Signed)
 NEEDS  TO RS 01/23/2025 NO VM

## 2024-03-10 NOTE — Telephone Encounter (Signed)
Called twice

## 2024-03-14 NOTE — Telephone Encounter (Signed)
 Left my chart message.

## 2024-03-17 NOTE — Telephone Encounter (Signed)
 Letter mailed 03/17/24

## 2025-01-23 ENCOUNTER — Encounter: Payer: PRIVATE HEALTH INSURANCE | Admitting: Internal Medicine
